# Patient Record
Sex: Male | Born: 1950 | Race: White | Hispanic: No | Marital: Single | Smoking: Current every day smoker
Health system: Southern US, Community
[De-identification: ages and names within clinical notes are randomized; demographics above are authoritative.]

## PROBLEM LIST (undated history)

## (undated) DIAGNOSIS — T7840XA Allergy, unspecified, initial encounter: Secondary | ICD-10-CM

## (undated) DIAGNOSIS — R519 Headache, unspecified: Secondary | ICD-10-CM

## (undated) DIAGNOSIS — F32A Depression, unspecified: Secondary | ICD-10-CM

## (undated) DIAGNOSIS — C029 Malignant neoplasm of tongue, unspecified: Secondary | ICD-10-CM

## (undated) DIAGNOSIS — D649 Anemia, unspecified: Secondary | ICD-10-CM

## (undated) DIAGNOSIS — F329 Major depressive disorder, single episode, unspecified: Secondary | ICD-10-CM

## (undated) DIAGNOSIS — E119 Type 2 diabetes mellitus without complications: Secondary | ICD-10-CM

## (undated) DIAGNOSIS — F419 Anxiety disorder, unspecified: Secondary | ICD-10-CM

## (undated) DIAGNOSIS — R0600 Dyspnea, unspecified: Secondary | ICD-10-CM

## (undated) HISTORY — DX: Malignant neoplasm of tongue, unspecified: C02.9

## (undated) HISTORY — DX: Allergy, unspecified, initial encounter: T78.40XA

## (undated) HISTORY — DX: Anemia, unspecified: D64.9

---
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
## 2019-08-15 DIAGNOSIS — K1379 Other lesions of oral mucosa: Secondary | ICD-10-CM

## 2019-08-15 HISTORY — DX: Other lesions of oral mucosa: K13.79

## 2019-09-01 ENCOUNTER — Other Ambulatory Visit: Payer: Self-pay

## 2019-09-01 ENCOUNTER — Inpatient Hospital Stay
Admission: EM | Admit: 2019-09-01 | Discharge: 2019-09-05 | DRG: 064 | Disposition: A | Discharge status: Home Health | Payer: Medicare Other | Attending: Internal Medicine | Admitting: Internal Medicine

## 2019-09-01 ENCOUNTER — Emergency Department: Payer: Medicare Other

## 2019-09-01 ENCOUNTER — Encounter: Payer: Self-pay | Admitting: Emergency Medicine

## 2019-09-01 ENCOUNTER — Inpatient Hospital Stay: Payer: Medicare Other

## 2019-09-01 DIAGNOSIS — E162 Hypoglycemia, unspecified: Secondary | ICD-10-CM

## 2019-09-01 DIAGNOSIS — R531 Weakness: Secondary | ICD-10-CM | POA: Diagnosis present

## 2019-09-01 DIAGNOSIS — F17210 Nicotine dependence, cigarettes, uncomplicated: Secondary | ICD-10-CM | POA: Diagnosis present

## 2019-09-01 DIAGNOSIS — E785 Hyperlipidemia, unspecified: Secondary | ICD-10-CM | POA: Diagnosis present

## 2019-09-01 DIAGNOSIS — E1151 Type 2 diabetes mellitus with diabetic peripheral angiopathy without gangrene: Secondary | ICD-10-CM | POA: Diagnosis present

## 2019-09-01 DIAGNOSIS — I6340 Cerebral infarction due to embolism of unspecified cerebral artery: Secondary | ICD-10-CM | POA: Diagnosis present

## 2019-09-01 DIAGNOSIS — R64 Cachexia: Secondary | ICD-10-CM | POA: Diagnosis present

## 2019-09-01 DIAGNOSIS — K5900 Constipation, unspecified: Secondary | ICD-10-CM

## 2019-09-01 DIAGNOSIS — Z880 Allergy status to penicillin: Secondary | ICD-10-CM

## 2019-09-01 DIAGNOSIS — Z20828 Contact with and (suspected) exposure to other viral communicable diseases: Secondary | ICD-10-CM | POA: Diagnosis present

## 2019-09-01 DIAGNOSIS — K567 Ileus, unspecified: Secondary | ICD-10-CM | POA: Diagnosis not present

## 2019-09-01 DIAGNOSIS — I493 Ventricular premature depolarization: Secondary | ICD-10-CM | POA: Diagnosis present

## 2019-09-01 DIAGNOSIS — Z885 Allergy status to narcotic agent status: Secondary | ICD-10-CM

## 2019-09-01 DIAGNOSIS — E11649 Type 2 diabetes mellitus with hypoglycemia without coma: Secondary | ICD-10-CM | POA: Diagnosis present

## 2019-09-01 DIAGNOSIS — I10 Essential (primary) hypertension: Secondary | ICD-10-CM | POA: Diagnosis present

## 2019-09-01 DIAGNOSIS — Z23 Encounter for immunization: Secondary | ICD-10-CM

## 2019-09-01 DIAGNOSIS — E43 Unspecified severe protein-calorie malnutrition: Secondary | ICD-10-CM | POA: Insufficient documentation

## 2019-09-01 DIAGNOSIS — Z01810 Encounter for preprocedural cardiovascular examination: Secondary | ICD-10-CM | POA: Diagnosis not present

## 2019-09-01 DIAGNOSIS — I69351 Hemiplegia and hemiparesis following cerebral infarction affecting right dominant side: Secondary | ICD-10-CM

## 2019-09-01 DIAGNOSIS — I639 Cerebral infarction, unspecified: Secondary | ICD-10-CM | POA: Diagnosis not present

## 2019-09-01 DIAGNOSIS — Z681 Body mass index (BMI) 19 or less, adult: Secondary | ICD-10-CM

## 2019-09-01 DIAGNOSIS — I361 Nonrheumatic tricuspid (valve) insufficiency: Secondary | ICD-10-CM | POA: Diagnosis not present

## 2019-09-01 DIAGNOSIS — M270 Developmental disorders of jaws: Secondary | ICD-10-CM

## 2019-09-01 DIAGNOSIS — E440 Moderate protein-calorie malnutrition: Secondary | ICD-10-CM | POA: Diagnosis present

## 2019-09-01 DIAGNOSIS — I6389 Other cerebral infarction: Secondary | ICD-10-CM | POA: Diagnosis not present

## 2019-09-01 HISTORY — DX: Cerebral infarction, unspecified: I63.9

## 2019-09-01 LAB — DIFFERENTIAL
Abs Immature Granulocytes: 0.06 10*3/uL (ref 0.00–0.07)
Basophils Absolute: 0 10*3/uL (ref 0.0–0.1)
Basophils Relative: 1 %
Eosinophils Absolute: 0.1 10*3/uL (ref 0.0–0.5)
Eosinophils Relative: 1 %
Immature Granulocytes: 1 %
Lymphocytes Relative: 19 %
Lymphs Abs: 1.5 10*3/uL (ref 0.7–4.0)
Monocytes Absolute: 0.4 10*3/uL (ref 0.1–1.0)
Monocytes Relative: 5 %
Neutro Abs: 5.5 10*3/uL (ref 1.7–7.7)
Neutrophils Relative %: 73 %

## 2019-09-01 LAB — COMPREHENSIVE METABOLIC PANEL
ALT: 9 U/L (ref 0–44)
AST: 23 U/L (ref 15–41)
Albumin: 3.8 g/dL (ref 3.5–5.0)
Alkaline Phosphatase: 48 U/L (ref 38–126)
Anion gap: 13 (ref 5–15)
BUN: 23 mg/dL (ref 8–23)
CO2: 20 mmol/L — ABNORMAL LOW (ref 22–32)
Calcium: 10 mg/dL (ref 8.9–10.3)
Chloride: 107 mmol/L (ref 98–111)
Creatinine, Ser: 0.91 mg/dL (ref 0.61–1.24)
GFR calc Af Amer: >60 mL/min (ref >60)
GFR calc non Af Amer: >60 mL/min (ref >60)
Glucose, Bld: 107 mg/dL — ABNORMAL HIGH (ref 70–99)
Potassium: 4.1 mmol/L (ref 3.5–5.1)
Sodium: 140 mmol/L (ref 135–145)
Total Bilirubin: 0.6 mg/dL (ref 0.3–1.2)
Total Protein: 7.5 g/dL (ref 6.5–8.1)

## 2019-09-01 LAB — URINALYSIS, COMPLETE (UACMP) WITH MICROSCOPIC
Bacteria, UA: NONE SEEN
Bilirubin Urine: NEGATIVE
Glucose, UA: 50 mg/dL — AB
Hgb urine dipstick: NEGATIVE
Ketones, ur: NEGATIVE mg/dL
Leukocytes,Ua: NEGATIVE
Nitrite: NEGATIVE
Protein, ur: NEGATIVE mg/dL
Specific Gravity, Urine: 1.029 (ref 1.005–1.030)
Squamous Epithelial / HPF: NONE SEEN (ref 0–5)
pH: 5 (ref 5.0–8.0)

## 2019-09-01 LAB — URINE DRUG SCREEN, QUALITATIVE (ARMC ONLY)
Amphetamines, Ur Screen: NOT DETECTED
Barbiturates, Ur Screen: NOT DETECTED
Benzodiazepine, Ur Scrn: NOT DETECTED
Cannabinoid 50 Ng, Ur ~~LOC~~: NOT DETECTED
Cocaine Metabolite,Ur ~~LOC~~: NOT DETECTED
MDMA (Ecstasy)Ur Screen: NOT DETECTED
Methadone Scn, Ur: NOT DETECTED
Opiate, Ur Screen: NOT DETECTED
Phencyclidine (PCP) Ur S: NOT DETECTED
Tricyclic, Ur Screen: NOT DETECTED

## 2019-09-01 LAB — CBC
HCT: 36.1 % — ABNORMAL LOW (ref 39.0–52.0)
Hemoglobin: 12.4 g/dL — ABNORMAL LOW (ref 13.0–17.0)
MCH: 35.2 pg — ABNORMAL HIGH (ref 26.0–34.0)
MCHC: 34.3 g/dL (ref 30.0–36.0)
MCV: 102.6 fL — ABNORMAL HIGH (ref 80.0–100.0)
Platelets: 305 10*3/uL (ref 150–400)
RBC: 3.52 MIL/uL — ABNORMAL LOW (ref 4.22–5.81)
RDW: 18.6 % — ABNORMAL HIGH (ref 11.5–15.5)
WBC: 7.5 10*3/uL (ref 4.0–10.5)
nRBC: 0 % (ref 0.0–0.2)

## 2019-09-01 LAB — PROTIME-INR
INR: 0.9 (ref 0.8–1.2)
Prothrombin Time: 11.7 seconds (ref 11.4–15.2)

## 2019-09-01 LAB — GLUCOSE, CAPILLARY
Glucose-Capillary: 106 mg/dL — ABNORMAL HIGH (ref 70–99)
Glucose-Capillary: 127 mg/dL — ABNORMAL HIGH (ref 70–99)
Glucose-Capillary: 201 mg/dL — ABNORMAL HIGH (ref 70–99)
Glucose-Capillary: 45 mg/dL — ABNORMAL LOW (ref 70–99)
Glucose-Capillary: 66 mg/dL — ABNORMAL LOW (ref 70–99)
Glucose-Capillary: 72 mg/dL (ref 70–99)
Glucose-Capillary: 89 mg/dL (ref 70–99)
Glucose-Capillary: 91 mg/dL (ref 70–99)

## 2019-09-01 LAB — ETHANOL: Alcohol, Ethyl (B): <10 mg/dL (ref <10)

## 2019-09-01 LAB — APTT: aPTT: 32 seconds (ref 24–36)

## 2019-09-01 LAB — SARS CORONAVIRUS 2 (TAT 6-24 HRS): SARS Coronavirus 2: NEGATIVE

## 2019-09-01 MED ORDER — DEXTROSE-NACL 5-0.9 % IV SOLN: INTRAVENOUS | Status: DC

## 2019-09-01 MED ORDER — ATORVASTATIN CALCIUM 80 MG PO TABS
80.0000 mg | ORAL_TABLET | Freq: Every day | ORAL | Status: DC
  Administered 2019-09-01 – 2019-09-05 (×5): 80 mg via ORAL
  Filled 2019-09-01: qty 4
  Filled 2019-09-01 (×3): qty 1
  Filled 2019-09-01: qty 4
  Filled 2019-09-01: qty 1
  Filled 2019-09-01: qty 4
  Filled 2019-09-01: qty 1

## 2019-09-01 MED ORDER — STROKE: EARLY STAGES OF RECOVERY BOOK: Freq: Once | Status: AC

## 2019-09-01 MED ORDER — HYDRALAZINE HCL 50 MG PO TABS: 25.0000 mg | ORAL_TABLET | Freq: Three times a day (TID) | ORAL | Status: DC | PRN

## 2019-09-01 MED ORDER — SODIUM CHLORIDE 0.9 % IV BOLUS
1000.0000 mL | Freq: Once | INTRAVENOUS | Status: AC
  Administered 2019-09-01: 1000 mL via INTRAVENOUS

## 2019-09-01 MED ORDER — DEXTROSE 50 % IV SOLN: 25.0000 mL | Freq: Once | INTRAVENOUS | Status: AC

## 2019-09-01 MED ORDER — ACETAMINOPHEN 650 MG RE SUPP: 650.0000 mg | RECTAL | Status: DC | PRN

## 2019-09-01 MED ORDER — ASPIRIN 81 MG PO CHEW
324.0000 mg | CHEWABLE_TABLET | Freq: Once | ORAL | Status: AC
  Administered 2019-09-01: 324 mg via ORAL
  Filled 2019-09-01: qty 4

## 2019-09-01 MED ORDER — ENOXAPARIN SODIUM 40 MG/0.4ML ~~LOC~~ SOLN
40.0000 mg | SUBCUTANEOUS | Status: DC
  Filled 2019-09-01: qty 0.4

## 2019-09-01 MED ORDER — DEXTROSE 50 % IV SOLN
INTRAVENOUS | Status: AC
  Filled 2019-09-01: qty 50

## 2019-09-01 MED ORDER — ACETAMINOPHEN 160 MG/5ML PO SOLN
650.0000 mg | ORAL | Status: DC | PRN
  Filled 2019-09-01: qty 20.3

## 2019-09-01 MED ORDER — ACETAMINOPHEN 325 MG PO TABS
650.0000 mg | ORAL_TABLET | ORAL | Status: DC | PRN
  Administered 2019-09-02 – 2019-09-04 (×3): 650 mg via ORAL
  Filled 2019-09-01 (×3): qty 2

## 2019-09-01 MED ORDER — ENSURE ENLIVE PO LIQD
237.0000 mL | Freq: Three times a day (TID) | ORAL | Status: DC
  Administered 2019-09-02 – 2019-09-05 (×10): 237 mL via ORAL

## 2019-09-01 MED ORDER — DEXTROSE 50 % IV SOLN
50.0000 mL | INTRAVENOUS | Status: DC | PRN
  Administered 2019-09-01: 50 mL via INTRAVENOUS

## 2019-09-01 MED ORDER — ONDANSETRON HCL 4 MG/2ML IJ SOLN: 4.0000 mg | Freq: Three times a day (TID) | INTRAMUSCULAR | Status: DC | PRN

## 2019-09-01 MED ORDER — DEXTROSE 50 % IV SOLN
INTRAVENOUS | Status: AC
  Administered 2019-09-01: 25 mL via INTRAVENOUS
  Filled 2019-09-01: qty 50

## 2019-09-01 MED ORDER — ASPIRIN 81 MG PO CHEW
324.0000 mg | CHEWABLE_TABLET | Freq: Every day | ORAL | Status: DC
  Administered 2019-09-02: 324 mg via ORAL
  Filled 2019-09-01 (×2): qty 4

## 2019-09-01 MED ORDER — SENNOSIDES-DOCUSATE SODIUM 8.6-50 MG PO TABS
1.0000 | ORAL_TABLET | Freq: Every evening | ORAL | Status: DC | PRN
  Administered 2019-09-05: 1 via ORAL
  Filled 2019-09-01: qty 1

## 2019-09-01 NOTE — ED Triage Notes (Signed)
Pt arrival from home due to weakness. Pt states he's had a previous stroke and that on Tuesday he collapsed and has been feeling left sided weakness since.

## 2019-09-01 NOTE — ED Notes (Signed)
ED TO INPATIENT HANDOFF REPORT  ED Nurse Name and Phone #: Torrie Mayers, RN [phone]  S Name/Age/Gender Eugene Strong 68 y.o. male Room/Bed: ED30A/ED30A  Code Status   Code Status: Full Code  Home/SNF/Other Home Patient oriented to: self, place, time and situation Is this baseline? Yes   Triage Complete: Triage complete  Chief Complaint Stroke [facility]) [I63.9]  Triage Note Pt arrival from home due to weakness. Pt states he's had a previous stroke and that on Tuesday he collapsed and has been feeling left sided weakness since.     Allergies Allergies  Allergen Reactions  . Codeine   . Penicillins     Level of Care/Admitting Diagnosis ED Disposition    ED Disposition Condition [facility]: [place] [[id number]]  Level of Care: Med-Surg [16]  Covid Evaluation: Asymptomatic Screening Protocol (No Symptoms)  [facility]: [id number]  Admitting Physician: Ivor Costa [[id number]]  Attending Physician: Ivor Costa [phone]  Estimated length of stay: past midnight tomorrow  Certification:: I certify this patient will need inpatient services for at least 2 midnights       B Medical/Surgery History Past Medical History:  Diagnosis Date  . Stroke [facility])    History reviewed. No pertinent surgical history.   A IV Location/Drains/Wounds Patient Lines/Drains/Airways Status   Active Line/Drains/Airways    Name:   Placement date:   Placement time:   Site:   Days:   Peripheral IV 09/01/19 Right Forearm   09/01/19    1039    Forearm   less than 1          Intake/Output Last 24 hours  Intake/Output Summary (Last 24 hours) at 09/01/2019 2355 Last data filed at 09/01/2019 1905 Gross per 24 hour  Intake 296.41 ml  Output --  Net 296.41 ml    Labs/Imaging Results for orders placed or performed during the hospital encounter of 09/01/19 (from the past 48 hour(s))  Glucose, capillary     Status: Abnormal   Collection Time:  09/01/19 10:31 AM  Result Value Ref Range   Glucose-Capillary 45 (L) 70 - 99 mg/dL  Glucose, capillary     Status: Abnormal   Collection Time: 09/01/19 10:34 AM  Result Value Ref Range   Glucose-Capillary 35 (LL) 70 - 99 mg/dL  CBC     Status: Abnormal   Collection Time: 09/01/19 10:38 AM  Result Value Ref Range   WBC 7.5 4.0 - 10.5 K/uL   RBC 3.52 (L) 4.22 - 5.81 MIL/uL   Hemoglobin 12.4 (L) 13.0 - 17.0 g/dL   HCT 36.1 (L) 39.0 - 52.0 %   MCV 102.6 (H) 80.0 - 100.0 fL   MCH 35.2 (H) 26.0 - 34.0 pg   MCHC 34.3 30.0 - 36.0 g/dL   RDW 18.6 (H) 11.5 - 15.5 %   Platelets 305 150 - 400 K/uL   nRBC 0.0 0.0 - 0.2 %    Comment: Performed at [facility], [address] [place], [place] [address]  Protime-INR     Status: None   Collection Time: 09/01/19 10:38 AM  Result Value Ref Range   Prothrombin Time 11.7 11.4 - 15.2 seconds   INR 0.9 0.8 - 1.2    Comment: (NOTE) INR goal varies based on device and disease states. Performed at [facility], [address] [place], [place] [address]   APTT     Status: None   Collection Time: 09/01/19 10:38 AM  Result  Value Ref Range   aPTT 32 24 - 36 seconds    Comment: Performed at [facility], [address] [place], [place] [address]  Comprehensive metabolic panel     Status: Abnormal   Collection Time: 09/01/19 10:38 AM  Result Value Ref Range   Sodium 140 135 - 145 mmol/L   Potassium 4.1 3.5 - 5.1 mmol/L   Chloride 107 98 - 111 mmol/L   CO2 20 (L) 22 - 32 mmol/L   Glucose, Bld 107 (H) 70 - 99 mg/dL   BUN 23 8 - 23 mg/dL   Creatinine, Ser 0.91 0.61 - 1.24 mg/dL   Calcium 10.0 8.9 - 10.3 mg/dL   Total Protein 7.5 6.5 - 8.1 g/dL   Albumin 3.8 3.5 - 5.0 g/dL   AST 23 15 - 41 U/L   ALT 9 0 - 44 U/L   Alkaline Phosphatase 48 38 - 126 U/L   Total Bilirubin 0.6 0.3 - 1.2 mg/dL   GFR calc non Af Amer >60 >60 mL/min   GFR calc Af Amer >60 >60 mL/min   Anion gap 13 5 - 15    Comment: Performed at [facility], [address] [place], [place] [address]  Differential     Status: None   Collection Time: 09/01/19 10:38 AM  Result Value Ref Range   Neutrophils Relative % 73 %   Neutro Abs 5.5 1.7 - 7.7 K/uL   Lymphocytes Relative 19 %   Lymphs Abs 1.5 0.7 - 4.0 K/uL   Monocytes Relative 5 %   Monocytes Absolute 0.4 0.1 - 1.0 K/uL   Eosinophils Relative 1 %   Eosinophils Absolute 0.1 0.0 - 0.5 K/uL   Basophils Relative 1 %   Basophils Absolute 0.0 0.0 - 0.1 K/uL   Immature Granulocytes 1 %   Abs Immature Granulocytes 0.06 0.00 - 0.07 K/uL    Comment: Performed at [facility], [address] [place], [place] [address]  Glucose, capillary     Status: Abnormal   Collection Time: 09/01/19 10:45 AM  Result Value Ref Range   Glucose-Capillary 106 (H) 70 - 99 mg/dL  Ethanol     Status: None   Collection Time: 09/01/19 11:05 AM  Result Value Ref Range   Alcohol, Ethyl (B) <10 <10 mg/dL    Comment: (NOTE) Lowest detectable limit for serum alcohol is 10 mg/dL. For medical purposes only. Performed at [facility], [place], [place] [address]   SARS CORONAVIRUS 2 (TAT 6-24 HRS) Nasopharyngeal Nasopharyngeal Swab     Status: None   Collection Time: 09/01/19 12:28 PM   Specimen: Nasopharyngeal Swab  Result Value Ref Range   SARS Coronavirus 2 NEGATIVE NEGATIVE    Comment: (NOTE) SARS-CoV-2 target nucleic acids are NOT DETECTED. The SARS-CoV-2 RNA is generally detectable in upper and lower respiratory specimens during the acute phase of infection. Negative results do not preclude SARS-CoV-2 infection, do not rule out co-infections with other pathogens, and should not be used as the sole basis for treatment or other patient management decisions. Negative results must be combined with clinical observations, patient history, and epidemiological information. The expected result is Negative. Fact Sheet for  Patients: SugarRoll.be Fact Sheet for Healthcare Providers: https://www.woods-mathews.com/ This test is not yet approved or cleared by the [place] FDA and  has been authorized for detection and/or diagnosis of SARS-CoV-2 by FDA under an Emergency Use Authorization (EUA). This EUA will remain  in effect (meaning  this test can be used) for the duration of the COVID-19 declaration under Section 56 4(b)(1) of the Act, 21 U.S.C. section 360bbb-3(b)(1), unless the authorization is terminated or revoked sooner. Performed at [facility], [place] [address], [place], [place] [address]   Glucose, capillary     Status: None   Collection Time: 09/01/19  2:05 PM  Result Value Ref Range   Glucose-Capillary 91 70 - 99 mg/dL  Glucose, capillary     Status: Abnormal   Collection Time: 09/01/19  4:01 PM  Result Value Ref Range   Glucose-Capillary 201 (H) 70 - 99 mg/dL  Glucose, capillary     Status: None   Collection Time: 09/01/19  7:04 PM  Result Value Ref Range   Glucose-Capillary 72 70 - 99 mg/dL  Glucose, capillary     Status: Abnormal   Collection Time: 09/01/19  8:17 PM  Result Value Ref Range   Glucose-Capillary 66 (L) 70 - 99 mg/dL  Glucose, capillary     Status: Abnormal   Collection Time: 09/01/19  9:41 PM  Result Value Ref Range   Glucose-Capillary 127 (H) 70 - 99 mg/dL  Urinalysis, Complete w Microscopic     Status: Abnormal   Collection Time: 09/01/19 10:11 PM  Result Value Ref Range   Color, Urine YELLOW (A) YELLOW   APPearance CLEAR (A) CLEAR   Specific Gravity, Urine 1.029 1.005 - 1.030   pH 5.0 5.0 - 8.0   Glucose, UA 50 (A) NEGATIVE mg/dL   Hgb urine dipstick NEGATIVE NEGATIVE   Bilirubin Urine NEGATIVE NEGATIVE   Ketones, ur NEGATIVE NEGATIVE mg/dL   Protein, ur NEGATIVE NEGATIVE mg/dL   Nitrite NEGATIVE NEGATIVE   Leukocytes,Ua NEGATIVE NEGATIVE   RBC / HPF 0-5 0 - 5 RBC/hpf   WBC, UA 0-5 0 - 5 WBC/hpf   Bacteria,  UA NONE SEEN NONE SEEN   Squamous Epithelial / LPF NONE SEEN 0 - 5   Mucus PRESENT    Hyaline Casts, UA PRESENT     Comment: Performed at [facility], [address], [place], [place] [address]  Urine Drug Screen, Qualitative     Status: None   Collection Time: 09/01/19 10:11 PM  Result Value Ref Range   Tricyclic, Ur Screen NONE DETECTED NONE DETECTED   Amphetamines, Ur Screen NONE DETECTED NONE DETECTED   MDMA (Ecstasy)Ur Screen NONE DETECTED NONE DETECTED   Cocaine Metabolite,Ur [place] NONE DETECTED NONE DETECTED   Opiate, Ur Screen NONE DETECTED NONE DETECTED   Phencyclidine (PCP) Ur S NONE DETECTED NONE DETECTED   Cannabinoid 50 Ng, Ur [place] NONE DETECTED NONE DETECTED   Barbiturates, Ur Screen NONE DETECTED NONE DETECTED   Benzodiazepine, Ur Scrn NONE DETECTED NONE DETECTED   Methadone Scn, Ur NONE DETECTED NONE DETECTED    Comment: (NOTE) Tricyclics + metabolites, urine    Cutoff 1000 ng/mL Amphetamines + metabolites, urine  Cutoff 1000 ng/mL MDMA (Ecstasy), urine              Cutoff 500 ng/mL Cocaine Metabolite, urine          Cutoff 300 ng/mL Opiate + metabolites, urine        Cutoff 300 ng/mL Phencyclidine (PCP), urine         Cutoff 25 ng/mL Cannabinoid, urine                 Cutoff 50 ng/mL Barbiturates + metabolites, urine  Cutoff 200 ng/mL Benzodiazepine, urine  Cutoff 200 ng/mL Methadone, urine                   Cutoff 300 ng/mL The urine drug screen provides only a preliminary, unconfirmed analytical test result and should not be used for non-medical purposes. Clinical consideration and professional judgment should be applied to any positive drug screen result due to possible interfering substances. A more specific alternate chemical method must be used in order to obtain a confirmed analytical result. Gas chromatography / mass spectrometry (GC/MS) is the preferred confirmat ory method. Performed at [facility], [address]  [place], [place] [address]   Glucose, capillary     Status: None   Collection Time: 09/01/19 11:40 PM  Result Value Ref Range   Glucose-Capillary 89 70 - 99 mg/dL   CT HEAD WO CONTRAST  Result Date: 09/01/2019 CLINICAL DATA:  Left-sided weakness EXAM: CT HEAD WITHOUT CONTRAST TECHNIQUE: Contiguous axial images were obtained from the base of the skull through the vertex without intravenous contrast. COMPARISON:  None. FINDINGS: Brain: There is mild diffuse atrophy. There is no intracranial mass, hemorrhage, extra-axial fluid collection, or midline shift. There is mild small vessel disease in the centra semiovale bilaterally. There is an area of decreased attenuation at the gray-white junction of the mid right frontal lobe, a finding concerning for recent and potentially acute infarct. There is decreased attenuation in the head of the caudate nucleus on the right and immediately adjacent anterior limb of the right external capsule. This area may represent an older infarct. No other findings suggesting potential acute infarct present. Vascular: No hyperdense vessel. There is calcification in each carotid siphon region. Skull: The bony calvarium appears intact. Sinuses/Orbits: There is mucosal thickening involving multiple ethmoid air cells. Other visualized paranasal sinuses are clear. There is a concha bullosa on the right, an anatomic variant. There is leftward deviation of the nasal septum. Visualized orbits appear symmetric bilaterally. Other: Visualized mastoid air cells are clear. IMPRESSION: 1. Atrophy with periventricular small vessel disease. Age uncertain but potentially recent/acute infarct in the mid right frontal lobe at the gray-white junction. Older appearing infarct involving a portion of the head of the caudate nucleus on the right in the immediately adjacent anterior limb of the right external capsule. No mass or hemorrhage. 2.  There are foci of arterial vascular calcification. 3. Mucosal  thickening in several ethmoid air cells. Deviated nasal septum. Electronically Signed   By: Lowella Grip III M.D.   On: 09/01/2019 11:37   MR ANGIO HEAD WO CONTRAST  Result Date: 09/01/2019 CLINICAL DATA:  Initial evaluation for focal neural deficit, possible stroke. EXAM: MRI HEAD WITHOUT CONTRAST MRA HEAD WITHOUT CONTRAST TECHNIQUE: Multiplanar, multiecho pulse sequences of the brain and surrounding structures were obtained without intravenous contrast. Angiographic images of the head were obtained using MRA technique without contrast. COMPARISON:  Prior CT from earlier the same day. FINDINGS: MRI HEAD FINDINGS Brain: Diffuse prominence of the CSF containing spaces compatible with generalized age-related cerebral atrophy. Patchy T2/FLAIR hyperintensity within the periventricular deep white matter both cerebral hemispheres most consistent with chronic small vessel ischemic disease, mild to moderate nature. Few scattered superimposed remote lacunar infarcts seen involving the bilateral thalami, basal ganglia, and hemispheric cerebral white matter. Small remote cortical/subcortical infarct noted involving the anterior right frontal lobe (series 20, image 32). Punctate 3 mm focus of restricted diffusion seen involving the cortex of the left parietal lobe, consistent with a small acute ischemic infarct (series 5, image 36). Additional  faint diffusion abnormality seen involving the cortex of the left occipital lobe (series 5, image 22), acute to subacute in appearance. No associated hemorrhage or mass effect. No other abnormal foci of restricted diffusion to suggest acute or subacute ischemia. Gray-white matter differentiation otherwise maintained. No acute intracranial hemorrhage. Single subcentimeter focus of susceptibility artifact noted within left parietal lobe, consistent with a small microhemorrhage, likely small vessel related. No mass lesion, midline shift or mass effect. No hydrocephalus. No  extra-axial fluid collection. Pituitary gland suprasellar region within normal limits. Midline structures intact and normal. Vascular: Major intracranial vascular flow voids are maintained. Skull and upper cervical spine: Craniocervical junction within normal limits. Bone marrow signal intensity normal. No scalp soft tissue abnormality. Sinuses/Orbits: Globes and orbital soft tissues within normal limits. Paranasal sinuses are largely clear. Right-to-left nasal septal deviation with associated concha bullosa noted. 8 mm well-circumscribed T2 hyperintense nodule at the left nasal bridge, indeterminate (series 15, image 7). Other: No mastoid effusion.  Inner ear structures grossly normal. MRA HEAD FINDINGS ANTERIOR CIRCULATION: Visualized distal cervical segments of the internal carotid arteries are widely patent with symmetric antegrade flow. Petrous segments widely patent. Multifocal atheromatous irregularity throughout the cavernous/supraclinoid ICAs without high-grade flow-limiting stenosis, slightly worse on the right. ICA termini well perfused. Right A1 widely patent. Short-segment severe stenosis noted at the proximal left M1 segment (series 9, image 106). Normal anterior communicating artery. Anterior cerebral arteries widely patent to their distal aspects without high-grade stenosis. No M1 stenosis or occlusion. Normal MCA bifurcations. Distal MCA branches well perfused and symmetric, although demonstrate diffuse small vessel atheromatous irregularity. POSTERIOR CIRCULATION: Dominant left vertebral artery widely patent to the vertebrobasilar junction. Patent left PICA. Right vertebral artery diffusely hypoplastic and not well seen, although there is flow related signal within the distal right V4 segment with perfusion of the right PICA. Basilar widely patent to its distal aspect without stenosis. Superior cerebral arteries patent bilaterally. Left PCA is supplied primarily via the basilar. Right PCA supplied  via the basilar as well as a robust right posterior communicating artery. Multifocal atheromatous irregularity throughout both PCAs, which are both patent to their distal aspects. Short-segment severe distal right P3 stenosis noted (series 1059, image 6). No intracranial aneurysm or other vascular abnormality. IMPRESSION: MRI HEAD IMPRESSION: 1. Few tiny subcentimeter acute to early subacute cortical infarcts involving the left parietal and occipital lobes as above. No associated hemorrhage. 2. Underlying age-related cerebral atrophy with moderate chronic microvascular ischemic disease. 3. 8 mm nodule involving the skin of the left nasal bridge, indeterminate. Correlation with physical exam recommended. MRA HEAD IMPRESSION: 1. No large vessel occlusion. 2. 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 No other proximal high-grade or correctable stenosis. Electronically Signed   By: Jeannine Boga M.D.   On: 09/01/2019 22:01   MR BRAIN WO CONTRAST  Result Date: 09/01/2019 CLINICAL DATA:  Initial evaluation for focal neural deficit, possible stroke. EXAM: MRI HEAD WITHOUT CONTRAST MRA HEAD WITHOUT CONTRAST TECHNIQUE: Multiplanar, multiecho pulse sequences of the brain and surrounding structures were obtained without intravenous contrast. Angiographic images of the head were obtained using MRA technique without contrast. COMPARISON:  Prior CT from earlier the same day. FINDINGS: MRI HEAD FINDINGS Brain: Diffuse prominence of the CSF containing spaces compatible with generalized age-related cerebral atrophy. Patchy T2/FLAIR hyperintensity within the periventricular deep white matter both cerebral hemispheres most consistent with chronic small vessel ischemic disease, mild to moderate nature. Few scattered superimposed remote lacunar infarcts  seen involving the bilateral thalami, basal ganglia, and hemispheric cerebral white matter. Small remote  cortical/subcortical infarct noted involving the anterior right frontal lobe (series 20, image 32). Punctate 3 mm focus of restricted diffusion seen involving the cortex of the left parietal lobe, consistent with a small acute ischemic infarct (series 5, image 36). Additional faint diffusion abnormality seen involving the cortex of the left occipital lobe (series 5, image 22), acute to subacute in appearance. No associated hemorrhage or mass effect. No other abnormal foci of restricted diffusion to suggest acute or subacute ischemia. Gray-white matter differentiation otherwise maintained. No acute intracranial hemorrhage. Single subcentimeter focus of susceptibility artifact noted within left parietal lobe, consistent with a small microhemorrhage, likely small vessel related. No mass lesion, midline shift or mass effect. No hydrocephalus. No extra-axial fluid collection. Pituitary gland suprasellar region within normal limits. Midline structures intact and normal. Vascular: Major intracranial vascular flow voids are maintained. Skull and upper cervical spine: Craniocervical junction within normal limits. Bone marrow signal intensity normal. No scalp soft tissue abnormality. Sinuses/Orbits: Globes and orbital soft tissues within normal limits. Paranasal sinuses are largely clear. Right-to-left nasal septal deviation with associated concha bullosa noted. 8 mm well-circumscribed T2 hyperintense nodule at the left nasal bridge, indeterminate (series 15, image 7). Other: No mastoid effusion.  Inner ear structures grossly normal. MRA HEAD FINDINGS ANTERIOR CIRCULATION: Visualized distal cervical segments of the internal carotid arteries are widely patent with symmetric antegrade flow. Petrous segments widely patent. Multifocal atheromatous irregularity throughout the cavernous/supraclinoid ICAs without high-grade flow-limiting stenosis, slightly worse on the right. ICA termini well perfused. Right A1 widely patent.  Short-segment severe stenosis noted at the proximal left M1 segment (series 9, image 106). Normal anterior communicating artery. Anterior cerebral arteries widely patent to their distal aspects without high-grade stenosis. No M1 stenosis or occlusion. Normal MCA bifurcations. Distal MCA branches well perfused and symmetric, although demonstrate diffuse small vessel atheromatous irregularity. POSTERIOR CIRCULATION: Dominant left vertebral artery widely patent to the vertebrobasilar junction. Patent left PICA. Right vertebral artery diffusely hypoplastic and not well seen, although there is flow related signal within the distal right V4 segment with perfusion of the right PICA. Basilar widely patent to its distal aspect without stenosis. Superior cerebral arteries patent bilaterally. Left PCA is supplied primarily via the basilar. Right PCA supplied via the basilar as well as a robust right posterior communicating artery. Multifocal atheromatous irregularity throughout both PCAs, which are both patent to their distal aspects. Short-segment severe distal right P3 stenosis noted (series 1059, image 6). No intracranial aneurysm or other vascular abnormality. IMPRESSION: MRI HEAD IMPRESSION: 1. Few tiny subcentimeter acute to early subacute cortical infarcts involving the left parietal and occipital lobes as above. No associated hemorrhage. 2. Underlying age-related cerebral atrophy with moderate chronic microvascular ischemic disease. 3. 8 mm nodule involving the skin of the left nasal bridge, indeterminate. Correlation with physical exam recommended. MRA HEAD IMPRESSION: 1. No large vessel occlusion. 2. 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 No other proximal high-grade or correctable stenosis. Electronically Signed   By: Jeannine Boga M.D.   On: 09/01/2019 22:01   DG Chest Portable 1 View  Result Date: 09/01/2019 CLINICAL DATA:  Weakness  with productive cough. EXAM: PORTABLE CHEST 1 VIEW COMPARISON:  None. FINDINGS: Lungs are hyperexpanded without consolidation or effusion. Cardiomediastinal silhouette, bones and soft tissues are normal. IMPRESSION: Hyperexpansion without acute cardiopulmonary disease. Electronically Signed   By: Marin Olp M.D.  On: 09/01/2019 11:37   CT Maxillofacial Wo Contrast  Result Date: 09/01/2019 CLINICAL DATA:  Facial swelling EXAM: CT MAXILLOFACIAL WITHOUT CONTRAST TECHNIQUE: Multidetector CT imaging of the maxillofacial structures was performed. Multiplanar CT image reconstructions were also generated. COMPARISON:  None. FINDINGS: Osseous: No acute fracture. Bony nasal septum is deviated to the left without fracture. Orbital walls are intact. Temporomandibular joints are aligned without dislocation. 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 Right-sided lesion measures up to 2.5 cm with encroachment on the floor of mouth and superiorly deviates the anterior tongue. Remaining mandibular dentition with small periapical lucencies. No cortical destruction or periostitis. Orbits: Negative. No traumatic or inflammatory finding. Sinuses: Clear. Soft tissues: No focal soft tissue swelling. No fluid collection. Limited intracranial: See concurrent CT brain report. IMPRESSION: 1. No acute facial bone fracture. 2. Remaining mandibular dentition with small periapical lucencies. No cortical destruction or periostitis. No focal soft tissue swelling or fluid collection. 3. 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 Right-sided lesion measures up to 2.5 cm with encroachment on the floor of mouth and superior deviation of the tongue. Electronically Signed   By: Davina Poke M.D.   On: 09/01/2019 11:50    Pending  Labs Unresulted Labs (From admission, onward)    Start     Ordered   09/02/19 0500  Hemoglobin A1c  Tomorrow morning,   STAT     09/01/19 1925   09/02/19 0500  Lipid panel  Tomorrow morning,   STAT    Comments: Fasting    09/01/19 1925   09/02/19 0500  HIV Antibody (routine testing w rflx)  (HIV Antibody (Routine testing w reflex) panel)  Once,   STAT     09/01/19 2113          Vitals/Pain Today's Vitals   09/01/19 1809 09/01/19 1923 09/01/19 2233 09/01/19 2341  BP: (!) 164/66 (!) 166/66 (!) 152/62 (!) 146/72  Pulse: (!) 59 61 60 65  Resp: 16 18 12  (!) 21  Temp:    97.6 F (36.4 C)  TempSrc:    Oral  SpO2: 100% 100% 100% 100%  Weight:      Height:      PainSc:   0-No pain     Isolation Precautions No active isolations  Medications Medications  dextrose 50 % solution 50 mL ( Intravenous Not Given 09/01/19 2232)   stroke: mapping our early stages of recovery book (has no administration in time range)  acetaminophen (TYLENOL) tablet 650 mg (has no administration in time range)    Or  acetaminophen (TYLENOL) 160 MG/5ML solution 650 mg (has no administration in time range)    Or  acetaminophen (TYLENOL) suppository 650 mg (has no administration in time range)  senna-docusate (Senokot-S) tablet 1 tablet (has no administration in time range)  enoxaparin (LOVENOX) injection 40 mg (has no administration in time range)  aspirin chewable tablet 324 mg (has no administration in time range)  atorvastatin (LIPITOR) tablet 80 mg (80 mg Oral Given 09/01/19 1745)  feeding supplement (ENSURE ENLIVE) (ENSURE ENLIVE) liquid 237 mL (237 mLs Oral Not Given 09/01/19 1924)  ondansetron (ZOFRAN) injection 4 mg (has no administration in time range)  hydrALAZINE (APRESOLINE) tablet 25 mg (has no administration in time range)  dextrose 50 % solution 25 mL (25 mLs Intravenous Given 09/01/19 1049)  sodium chloride  0.9 % bolus 1,000 mL (0 mLs Intravenous Stopped 09/01/19 1334)  aspirin chewable  tablet 324 mg (324 mg Oral Given 09/01/19 1215)    Mobility walks Low fall risk   Focused Assessments Neuro Assessment Handoff:  Swallow screen pass? Yes          Neuro Assessment: Within Defined Limits Neuro Checks:      Last Documented NIHSS Modified Score:   Has TPA been given? No If patient is a Neuro Trauma and patient is going to OR before floor call report to [place] nurse: [phone] or [phone]     R Recommendations: See Admitting Provider Note  Report given to:   Additional Notes:  Pt 's blood sugar being monitor every 2 hrs.

## 2019-09-01 NOTE — ED Notes (Signed)
Pt had blood sugar of 45 and then 35 the second time. Pt has cold extremities, hasn't eaten in a few days. Pt given apple juice with sugar. 3rd blood sugar 106, Dr. Quentin Cornwall notified.

## 2019-09-01 NOTE — ED Notes (Signed)
Bear hugger applied to warm pt.

## 2019-09-01 NOTE — H&P (Addendum)
History and Physical    Eugene Strong [id number] DOB: Jan 07, 1951 DOA: 09/01/2019  Referring MD/NP/PA:   PCP: Patient, No Pcp Per   Patient coming from:  The patient is coming from home.  At baseline, pt is independent for most of ADL.        Chief Complaint: Left-sided weakness  HPI: Eugene Strong is a 67 y.o. male with medical history significant of stroke, former smoker, not seeing doctors for 20 years, who presents with left-sided weakness.  Pt states that he may have had another stroke 1 week ago, decreased as left sided weakness, and fall without significant injury. Did not seek medical attention. No headache or neck pain.  No vision loss or hearing loss. He states he also had an episode over 1 month ago where he initially thought he had a stroke where he became weak on the left side, and since then has had some slurred speech.  Denies chest pain, shortness of breath, cough,.  No nausea vomiting, diarrhea, abdominal pain, symptoms of UTI.  States has been having some sores and swelling in his mouth causing him to have decreased oral intake. Pt was found to have hypoglycemia with blood sugar 30s, which improved to 90 -110 after giving D50.  ED Course: pt was found to have WBC 7.5, INR 0.9, PTT 32, pending COVID-19 test, alcohol less than 10, electrolytes renal function okay, blood pressure 182/73, heart rate 62, oxygen saturation 95% on room air.  Chest x-ray has no infiltration.  Patient is admitted to [facility] bed as inpatient.  Message sent to Dr. Doy Mince of neuro for consultation.  CT-head showed: 1. Atrophy with periventricular small vessel disease. Age uncertain but potentially recent/acute infarct in the mid right frontal lobe at the gray-white junction. Older appearing infarct involving a portion of the head of the caudate nucleus on the right in the immediately adjacent anterior limb of the right external capsule. No mass or hemorrhage. 2.  There are foci of arterial vascular  calcification. 3. Mucosal thickening in several ethmoid air cells. Deviated nasal septum.  CT-maxillofacial: 1. No acute facial bone fracture. 2. Remaining mandibular dentition with small periapical lucencies. No cortical destruction or periostitis. No focal soft tissue swelling or fluid collection.  3. 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 Right-sided lesion measures up to 2.5 cm with encroachment on the floor of mouth and superior deviation of the tongue.   Review of Systems:   General: no fevers, chills, no body weight gain, has poor appetite, has fatigue HEENT: no blurry vision, hearing changes or sore throat Respiratory: no dyspnea, coughing, wheezing CV: no chest pain, no palpitations GI: no nausea, vomiting, abdominal pain, diarrhea, constipation GU: no dysuria, burning on urination, increased urinary frequency, hematuria  Ext: no leg edema Neuro: has left sided weakness, no vision change or hearing loss Skin: no rash, no skin tear. MSK: No muscle spasm, no deformity, no limitation of range of movement in spin Heme: No easy bruising.  Travel history: No recent long distant travel.  Allergy:  Allergies  Allergen Reactions  . Codeine   . Penicillins     Past Medical History:  Diagnosis Date  . Stroke [facility])     History reviewed. No pertinent surgical history.  Social History:  reports that he has quit smoking. He has never used smokeless tobacco. He reports previous alcohol use. He reports that he does not use  drugs.  Family History:  Family History  Problem Relation Age of Onset  . Goiter Father      Prior to Admission medications   Not on File    Physical Exam: Vitals:   09/01/19 1603 09/01/19 1630 09/01/19 1700 09/01/19 1809  BP:  (!) 164/61 (!) 151/70 (!) 164/66  Pulse:  (!) 59 69 (!) 59  Resp:   18 16  Temp: 97.9 F (36.6 C)     TempSrc: Axillary      SpO2:  100% 100% 100%  Weight:      Height:       General: Not in acute distress. Thin body habitus HEENT:       Eyes: PERRL, EOMI, no scleral icterus.       ENT: No discharge from the ears and nose, no pharynx injection, no tonsillar enlargement.        Neck: No JVD, no bruit, no mass felt. Heme: No neck lymph node enlargement. Cardiac: S1/S2, RRR, No murmurs, No gallops or rubs. Respiratory: No rales, wheezing, rhonchi or rubs. GI: Soft, nondistended, nontender, no rebound pain, no organomegaly, BS present. GU: No hematuria Ext: No pitting leg edema bilaterally. 2+DP/PT pulse bilaterally. Musculoskeletal: No joint deformities, No joint redness or warmth, no limitation of ROM in spin. Skin: No rashes.  Neuro: Alert, oriented X3, cranial nerves II-XII grossly intact, moves all extremities normally. Muscle strength 5/5 in all extremities, sensation to light touch intact. Psych: Patient is not psychotic, no suicidal or hemocidal ideation.  Labs on Admission: I have personally reviewed following labs and imaging studies  CBC: Recent Labs  Lab 09/01/19 1038  WBC 7.5  NEUTROABS 5.5  HGB 12.4*  HCT 36.1*  MCV 102.6*  PLT 123456   Basic Metabolic Panel: Recent Labs  Lab 09/01/19 1038  NA 140  K 4.1  CL 107  CO2 20*  GLUCOSE 107*  BUN 23  CREATININE 0.91  CALCIUM 10.0   GFR: Estimated Creatinine Clearance: 44.2 mL/min (by C-G formula based on SCr of 0.91 mg/dL). Liver Function Tests: Recent Labs  Lab 09/01/19 1038  AST 23  ALT 9  ALKPHOS 48  BILITOT 0.6  PROT 7.5  ALBUMIN 3.8   No results for input(s): LIPASE, AMYLASE in the last 168 hours. No results for input(s): AMMONIA in the last 168 hours. Coagulation Profile: Recent Labs  Lab 09/01/19 1038  INR 0.9   Cardiac Enzymes: No results for input(s): CKTOTAL, CKMB, CKMBINDEX, TROPONINI in the last 168 hours. BNP (last 3 results) No results for input(s): PROBNP in the last 8760 hours. HbA1C: No results  for input(s): HGBA1C in the last 72 hours. CBG: Recent Labs  Lab 09/01/19 1031 09/01/19 1034 09/01/19 1045 09/01/19 1405 09/01/19 1601  GLUCAP 45* 35* 106* 91 201*   Lipid Profile: No results for input(s): CHOL, HDL, LDLCALC, TRIG, CHOLHDL, LDLDIRECT in the last 72 hours. Thyroid Function Tests: No results for input(s): TSH, T4TOTAL, FREET4, T3FREE, THYROIDAB in the last 72 hours. Anemia Panel: No results for input(s): VITAMINB12, FOLATE, FERRITIN, TIBC, IRON, RETICCTPCT in the last 72 hours. Urine analysis: No results found for: COLORURINE, APPEARANCEUR, LABSPEC, PHURINE, GLUCOSEU, HGBUR, BILIRUBINUR, KETONESUR, PROTEINUR, UROBILINOGEN, NITRITE, LEUKOCYTESUR Sepsis Labs: @LABRCNTIP (procalcitonin:4,lacticidven:4) )No results found for this or any previous visit (from the past 240 hour(s)).   Radiological Exams on Admission: CT HEAD WO CONTRAST  Result Date: 09/01/2019 CLINICAL DATA:  Left-sided weakness EXAM: CT HEAD WITHOUT CONTRAST TECHNIQUE: Contiguous axial images were obtained from the base of the  skull through the vertex without intravenous contrast. COMPARISON:  None. FINDINGS: Brain: There is mild diffuse atrophy. There is no intracranial mass, hemorrhage, extra-axial fluid collection, or midline shift. There is mild small vessel disease in the centra semiovale bilaterally. There is an area of decreased attenuation at the gray-white junction of the mid right frontal lobe, a finding concerning for recent and potentially acute infarct. There is decreased attenuation in the head of the caudate nucleus on the right and immediately adjacent anterior limb of the right external capsule. This area may represent an older infarct. No other findings suggesting potential acute infarct present. Vascular: No hyperdense vessel. There is calcification in each carotid siphon region. Skull: The bony calvarium appears intact. Sinuses/Orbits: There is mucosal thickening involving multiple ethmoid air  cells. Other visualized paranasal sinuses are clear. There is a concha bullosa on the right, an anatomic variant. There is leftward deviation of the nasal septum. Visualized orbits appear symmetric bilaterally. Other: Visualized mastoid air cells are clear. IMPRESSION: 1. Atrophy with periventricular small vessel disease. Age uncertain but potentially recent/acute infarct in the mid right frontal lobe at the gray-white junction. Older appearing infarct involving a portion of the head of the caudate nucleus on the right in the immediately adjacent anterior limb of the right external capsule. No mass or hemorrhage. 2.  There are foci of arterial vascular calcification. 3. Mucosal thickening in several ethmoid air cells. Deviated nasal septum. Electronically Signed   By: Lowella Grip III M.D.   On: 09/01/2019 11:37   DG Chest Portable 1 View  Result Date: 09/01/2019 CLINICAL DATA:  Weakness with productive cough. EXAM: PORTABLE CHEST 1 VIEW COMPARISON:  None. FINDINGS: Lungs are hyperexpanded without consolidation or effusion. Cardiomediastinal silhouette, bones and soft tissues are normal. IMPRESSION: Hyperexpansion without acute cardiopulmonary disease. Electronically Signed   By: Marin Olp M.D.   On: 09/01/2019 11:37   CT Maxillofacial Wo Contrast  Result Date: 09/01/2019 CLINICAL DATA:  Facial swelling EXAM: CT MAXILLOFACIAL WITHOUT CONTRAST TECHNIQUE: Multidetector CT imaging of the maxillofacial structures was performed. Multiplanar CT image reconstructions were also generated. COMPARISON:  None. FINDINGS: Osseous: No acute fracture. Bony nasal septum is deviated to the left without fracture. Orbital walls are intact. Temporomandibular joints are aligned without dislocation. 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 Right-sided lesion measures up to 2.5 cm with encroachment on the floor of mouth  and superiorly deviates the anterior tongue. Remaining mandibular dentition with small periapical lucencies. No cortical destruction or periostitis. Orbits: Negative. No traumatic or inflammatory finding. Sinuses: Clear. Soft tissues: No focal soft tissue swelling. No fluid collection. Limited intracranial: See concurrent CT brain report. IMPRESSION: 1. No acute facial bone fracture. 2. Remaining mandibular dentition with small periapical lucencies. No cortical destruction or periostitis. No focal soft tissue swelling or fluid collection. 3. 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 Right-sided lesion measures up to 2.5 cm with encroachment on the floor of mouth and superior deviation of the tongue. Electronically Signed   By: Davina Poke M.D.   On: 09/01/2019 11:50     EKG: Independently reviewed.  Sinus rhythm, QTC 409, anteroseptal infarction pattern   Assessment/Plan Principal Problem:   Stroke [facility]) Active Problems:   Hypoglycemia   HTN (hypertension)   Protein-calorie malnutrition, moderate ([place])   Stroke ([place]): CT-head showed age uncertain but  potentially recent/acute infarct in the mid right frontal lobe at the gray-white junction. Older appearing infarct involving a portion of the head of the caudate nucleus on the right in the immediately adjacent anterior limb of the right external capsule.  Message sent to Dr. Doy Mince for consultation.  Patient does not have significant weakness in the arms or legs on physical examination.  - will admit to tele bed  - will follow up Neurology's Recs.  - Obtain MRI/MRA  - Check carotid dopplers  - start ASA and lipitor - fasting lipid panel and HbA1c  - 2D transthoracic echocardiography  - swallowing screen. 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  I certify that at the point of admission it is my clinical judgment that the patient will require inpatient hospital care spanning beyond 2 midnights from the point of admission.  . This patient has multiple chronic comorbidities including stroke, former smoker, not seeing doctors for 20 years, . Now patient has presenting with acute/subacute stroke and hypoglycemia . The initial radiographic and laboratory data are worrisome because of CT-head findings of stroke and hypoglycemia . Current medical needs: please see my assessment and plan . Predictability of an adverse outcome (risk): Patient has multiple comorbidities, now presents with acute stroke.  His presentation is highly complicated.  Patient is at high risk of deteriorating.  Need to be treated in hospital for at least 2 days.        DVT ppx:  SQ Lovenox Code Status: Full code Family Communication: None at bed side.      Disposition Plan:  Anticipate discharge back to previous home environment Consults called:  Message sent to Dr. Doy Mince for consultation Admission status: med-surg for obs   Date of Service 09/01/2019    [facility]   If 7PM-7AM, please contact night-coverage [email or web address] Password [facility] 09/01/2019, 6:21 PM

## 2019-09-01 NOTE — ED Notes (Signed)
Patient transported to MRI 

## 2019-09-01 NOTE — ED Provider Notes (Signed)
[facility] Emergency Department Provider Note    First MD Initiated Contact with Patient 09/01/19 1025     (approximate)  I have reviewed the triage vital signs and the nursing notes.   HISTORY  Chief Complaint Weakness    HPI Eugene Strong is a 68 y.o. male with reported history of stroke presents the ER for evaluation of "feeling terrible "for the past week.  States he thinks he had another stroke roughly 1 week ago states that he was outside a lot fell due to right-sided weakness.  States he was held back in his house.  Did not seek medical attention.  Is not having a headache.  States has been having some sores and swelling in his mouth causing him to have decreased oral intake.  Denies any abdominal pain chest pain.  Denies blood thinners.  Does smoke pack cigarettes per day.  Denies any history of cancer.  Less known normal was over 1 week ago.   States he also had an episode over 1 month ago where he initially thought he had a stroke where he became weak on the right side and since then has had some slurred speech.  He did not seek medical attention after that event either.   Past Medical History:  Diagnosis Date  . Stroke [facility])    History reviewed. No pertinent family history. History reviewed. No pertinent surgical history. There are no problems to display for this patient.     Prior to Admission medications   Not on File    Allergies Codeine and Penicillins    Social History Social History   Tobacco Use  . Smoking status: Former Research scientist (life sciences)  . Smokeless tobacco: Never Used  Substance Use Topics  . Alcohol use: Not Currently  . Drug use: Not on file    Review of Systems Patient denies headaches, rhinorrhea, blurry vision, numbness, shortness of breath, chest pain, edema, cough, abdominal pain, nausea, vomiting, diarrhea, dysuria, fevers, rashes or hallucinations unless otherwise stated above in  HPI. ____________________________________________   PHYSICAL EXAM:  VITAL SIGNS: Vitals:   09/01/19 1100  BP: (!) 182/73  Pulse: 62  SpO2: 95%    Constitutional: Alert and oriented.  Eyes: Conjunctivae are normal.  Head: Atraumatic. Nose: No congestion/rhinnorhea. Mouth/Throat: Mucous membranes are moist.  Multiple large firm masses on the lower jaw with carious dentition.  No fluctuance.  Left aspect of the tongue is painful with granulated tissue.  No bleeding. Neck: No stridor. Painless ROM.  Cardiovascular: Normal rate, regular rhythm. Grossly normal heart sounds.  Tips of left hand are dusky,  Good radial and ulnar pulse Respiratory: Normal respiratory effort.  No retractions. Lungs CTAB. Gastrointestinal: Soft and nontender. No distention. No abdominal bruits. No CVA tenderness. Genitourinary:  Musculoskeletal: No lower extremity tenderness nor edema.  No joint effusions. Neurologic:  Normal speech and language. No gross focal neurologic deficits are appreciated. No facial droop, + left sided drift Skin:  Skin is warm, dry and intact. No rash noted. Psychiatric: Mood and affect are normal. Speech and behavior are normal.  ____________________________________________   LABS (all labs ordered are listed, but only abnormal results are displayed)  Results for orders placed or performed during the hospital encounter of 09/01/19 (from the past 24 hour(s))  Glucose, capillary     Status: Abnormal   Collection Time: 09/01/19 10:31 AM  Result Value Ref Range   Glucose-Capillary 45 (L) 70 - 99 mg/dL  Glucose, capillary  Status: Abnormal   Collection Time: 09/01/19 10:34 AM  Result Value Ref Range   Glucose-Capillary 35 (LL) 70 - 99 mg/dL  CBC     Status: Abnormal   Collection Time: 09/01/19 10:38 AM  Result Value Ref Range   WBC 7.5 4.0 - 10.5 K/uL   RBC 3.52 (L) 4.22 - 5.81 MIL/uL   Hemoglobin 12.4 (L) 13.0 - 17.0 g/dL   HCT 36.1 (L) 39.0 - 52.0 %   MCV 102.6 (H)  80.0 - 100.0 fL   MCH 35.2 (H) 26.0 - 34.0 pg   MCHC 34.3 30.0 - 36.0 g/dL   RDW 18.6 (H) 11.5 - 15.5 %   Platelets 305 150 - 400 K/uL   nRBC 0.0 0.0 - 0.2 %  Protime-INR     Status: None   Collection Time: 09/01/19 10:38 AM  Result Value Ref Range   Prothrombin Time 11.7 11.4 - 15.2 seconds   INR 0.9 0.8 - 1.2  APTT     Status: None   Collection Time: 09/01/19 10:38 AM  Result Value Ref Range   aPTT 32 24 - 36 seconds  Comprehensive metabolic panel     Status: Abnormal   Collection Time: 09/01/19 10:38 AM  Result Value Ref Range   Sodium 140 135 - 145 mmol/L   Potassium 4.1 3.5 - 5.1 mmol/L   Chloride 107 98 - 111 mmol/L   CO2 20 (L) 22 - 32 mmol/L   Glucose, Bld 107 (H) 70 - 99 mg/dL   BUN 23 8 - 23 mg/dL   Creatinine, Ser 0.91 0.61 - 1.24 mg/dL   Calcium 10.0 8.9 - 10.3 mg/dL   Total Protein 7.5 6.5 - 8.1 g/dL   Albumin 3.8 3.5 - 5.0 g/dL   AST 23 15 - 41 U/L   ALT 9 0 - 44 U/L   Alkaline Phosphatase 48 38 - 126 U/L   Total Bilirubin 0.6 0.3 - 1.2 mg/dL   GFR calc non Af Amer >60 >60 mL/min   GFR calc Af Amer >60 >60 mL/min   Anion gap 13 5 - 15  Differential     Status: None   Collection Time: 09/01/19 10:38 AM  Result Value Ref Range   Neutrophils Relative % 73 %   Neutro Abs 5.5 1.7 - 7.7 K/uL   Lymphocytes Relative 19 %   Lymphs Abs 1.5 0.7 - 4.0 K/uL   Monocytes Relative 5 %   Monocytes Absolute 0.4 0.1 - 1.0 K/uL   Eosinophils Relative 1 %   Eosinophils Absolute 0.1 0.0 - 0.5 K/uL   Basophils Relative 1 %   Basophils Absolute 0.0 0.0 - 0.1 K/uL   Immature Granulocytes 1 %   Abs Immature Granulocytes 0.06 0.00 - 0.07 K/uL  Glucose, capillary     Status: Abnormal   Collection Time: 09/01/19 10:45 AM  Result Value Ref Range   Glucose-Capillary 106 (H) 70 - 99 mg/dL  Ethanol     Status: None   Collection Time: 09/01/19 11:05 AM  Result Value Ref Range   Alcohol, Ethyl (B) <10 <10 mg/dL   ____________________________________________  EKG My review and  personal interpretation at Time: 10:37 Indication: weakness  Rate: 60  Rhythm: sinus Axis: normal Other: normal intervals, no stemi ____________________________________________  RADIOLOGY  I personally reviewed all radiographic images ordered to evaluate for the above acute complaints and reviewed radiology reports and findings.  These findings were personally discussed with the patient.  Please see medical record for  radiology report.  ____________________________________________   PROCEDURES  Procedure(s) performed:  Procedures    Critical Care performed: no ____________________________________________   INITIAL IMPRESSION / ASSESSMENT AND PLAN / ED COURSE  Pertinent labs & imaging results that were available during my care of the patient were reviewed by me and considered in my medical decision making (see chart for details).   DDX: dehdyration, hypoglycemia, sepsis, cva, malignancy, aki, dehydration, electrolyte abn,   MATEO SHAPIRO is a 68 y.o. who presents to the ED with symptoms as described above.  Patient arrives frail appearing with symptoms as described above.  Noted to be hypoglycemic to 30s.  Was given D50 and applesauce after swallow screen.  Patient outside of the window for code stroke symptoms seem to be subacute in nature but certainly concerning for stroke.  Blood work is reassuring.  CT imaging does have show evidence of recent infarct.  He is mildly hypertensive.  Will give IV fluids give aspirin and admit to the hospitalist for further medical management.     The patient was evaluated in Emergency Department today for the symptoms described in the history of present illness. He/she was evaluated in the context of the global COVID-19 pandemic, which necessitated consideration that the patient might be at risk for infection with the SARS-CoV-2 virus that causes COVID-19. Institutional protocols and algorithms that pertain to the evaluation of patients at risk for  COVID-19 are in a state of rapid change based on information released by regulatory bodies including the CDC and federal and state organizations. These policies and algorithms were followed during the patient's care in the ED.  As part of my medical decision making, I reviewed the following data within the [facility] notes reviewed and incorporated, Labs reviewed, notes from prior ED visits and [place] Controlled Substance Database   ____________________________________________   FINAL CLINICAL IMPRESSION(S) / ED DIAGNOSES  Final diagnoses:  Cerebrovascular accident (CVA), unspecified mechanism ([place])  Hypoglycemia  Weakness      NEW MEDICATIONS STARTED DURING THIS VISIT:  New Prescriptions   No medications on file     Note:  This document was prepared using Dragon voice recognition software and may include unintentional dictation errors.    Merlyn Lot, MD 09/01/19 1153

## 2019-09-02 ENCOUNTER — Inpatient Hospital Stay: Payer: Medicare Other

## 2019-09-02 ENCOUNTER — Inpatient Hospital Stay (HOSPITAL_COMMUNITY)
Admit: 2019-09-02 | Discharge: 2019-09-02 | Disposition: A | Discharge status: Home / Self Care | Payer: Medicare Other | Attending: Internal Medicine | Admitting: Internal Medicine

## 2019-09-02 DIAGNOSIS — I361 Nonrheumatic tricuspid (valve) insufficiency: Secondary | ICD-10-CM

## 2019-09-02 DIAGNOSIS — I6340 Cerebral infarction due to embolism of unspecified cerebral artery: Principal | ICD-10-CM

## 2019-09-02 LAB — GLUCOSE, CAPILLARY
Glucose-Capillary: 76 mg/dL (ref 70–99)
Glucose-Capillary: 72 mg/dL (ref 70–99)
Glucose-Capillary: 84 mg/dL (ref 70–99)
Glucose-Capillary: 79 mg/dL (ref 70–99)
Glucose-Capillary: 90 mg/dL (ref 70–99)
Glucose-Capillary: 79 mg/dL (ref 70–99)
Glucose-Capillary: 111 mg/dL — ABNORMAL HIGH (ref 70–99)
Glucose-Capillary: 20 mg/dL — CL (ref 70–99)
Glucose-Capillary: 88 mg/dL (ref 70–99)
Glucose-Capillary: 90 mg/dL (ref 70–99)

## 2019-09-02 LAB — ECHOCARDIOGRAM COMPLETE
Height: 74 in
Weight: 1440 oz

## 2019-09-02 LAB — LIPID PANEL
Cholesterol: 161 mg/dL (ref 0–200)
HDL: 49 mg/dL (ref >40)
LDL Cholesterol: 85 mg/dL (ref 0–99)
Total CHOL/HDL Ratio: 3.3 RATIO
Triglycerides: 137 mg/dL (ref <150)
VLDL: 27 mg/dL (ref 0–40)

## 2019-09-02 LAB — HEMOGLOBIN A1C
Hgb A1c MFr Bld: 6.6 % — ABNORMAL HIGH (ref 4.8–5.6)
Mean Plasma Glucose: 142.72 mg/dL

## 2019-09-02 LAB — GLUCOSE, POCT (MANUAL RESULT ENTRY): POC Glucose: 88 mg/dl (ref 70–99)

## 2019-09-02 LAB — HIV ANTIBODY (ROUTINE TESTING W REFLEX): HIV Screen 4th Generation wRfx: NONREACTIVE

## 2019-09-02 MED ORDER — INSULIN ASPART 100 UNIT/ML ~~LOC~~ SOLN
0.0000 [IU] | Freq: Three times a day (TID) | SUBCUTANEOUS | Status: DC
  Filled 2019-09-02: qty 0.06

## 2019-09-02 MED ORDER — ENOXAPARIN SODIUM 30 MG/0.3ML ~~LOC~~ SOLN
30.0000 mg | SUBCUTANEOUS | Status: DC
  Administered 2019-09-02: 30 mg via SUBCUTANEOUS
  Filled 2019-09-02 (×2): qty 0.3

## 2019-09-02 MED ORDER — CLOPIDOGREL BISULFATE 75 MG PO TABS
75.0000 mg | ORAL_TABLET | Freq: Every day | ORAL | Status: DC
  Administered 2019-09-02 – 2019-09-05 (×4): 75 mg via ORAL
  Filled 2019-09-02 (×4): qty 1

## 2019-09-02 MED ORDER — ASPIRIN EC 81 MG PO TBEC
81.0000 mg | DELAYED_RELEASE_TABLET | Freq: Every day | ORAL | Status: DC
  Administered 2019-09-03 – 2019-09-05 (×3): 81 mg via ORAL
  Filled 2019-09-02 (×3): qty 1

## 2019-09-02 MED ORDER — LIDOCAINE VISCOUS HCL 2 % MT SOLN
15.0000 mL | OROMUCOSAL | Status: DC | PRN
  Administered 2019-09-02 – 2019-09-03 (×2): 15 mL via OROMUCOSAL
  Filled 2019-09-02 (×4): qty 15

## 2019-09-02 MED ORDER — PNEUMOCOCCAL VAC POLYVALENT 25 MCG/0.5ML IJ INJ
0.5000 mL | INJECTION | INTRAMUSCULAR | Status: AC
  Administered 2019-09-03: 09:00:00 0.5 mL via INTRAMUSCULAR
  Filled 2019-09-02: qty 0.5

## 2019-09-02 MED ORDER — POLYETHYLENE GLYCOL 3350 17 G PO PACK
17.0000 g | PACK | Freq: Every day | ORAL | Status: DC
  Filled 2019-09-02: qty 1

## 2019-09-02 MED ORDER — MAGIC MOUTHWASH
15.0000 mL | Freq: Four times a day (QID) | ORAL | Status: DC
  Administered 2019-09-02 – 2019-09-05 (×15): 15 mL via ORAL
  Filled 2019-09-02 (×16): qty 15

## 2019-09-02 MED ORDER — ORAL CARE MOUTH RINSE
15.0000 mL | Freq: Two times a day (BID) | OROMUCOSAL | Status: DC
  Administered 2019-09-02 – 2019-09-05 (×6): 15 mL via OROMUCOSAL

## 2019-09-02 MED ORDER — INFLUENZA VAC A&B SA ADJ QUAD 0.5 ML IM PRSY
0.5000 mL | PREFILLED_SYRINGE | INTRAMUSCULAR | Status: AC
  Administered 2019-09-03: 0.5 mL via INTRAMUSCULAR
  Filled 2019-09-02: qty 0.5

## 2019-09-02 MED ORDER — ENOXAPARIN SODIUM 40 MG/0.4ML ~~LOC~~ SOLN
40.0000 mg | Freq: Every day | SUBCUTANEOUS | Status: DC
  Administered 2019-09-02: 40 mg via SUBCUTANEOUS
  Filled 2019-09-02 (×2): qty 0.4

## 2019-09-02 MED ORDER — BISACODYL 10 MG RE SUPP: 10.0000 mg | Freq: Once | RECTAL | Status: DC

## 2019-09-02 NOTE — Progress Notes (Signed)
[facility] Progress Note  Patient: Eugene Strong [id number]   PCP: Patient, No Pcp Per DOB: [birth year]   DOA: 09/01/2019   DOS: 09/02/2019   Date of Service: the patient was seen and examined on 09/02/2019  Chief Complaint  Patient presents with   Weakness   Brief hospital course: Pt. with PMH of CVA, smoker, weight loss; presented with complain of left-sided weakness, was found to have acute embolic CVA.  Currently further plan is for the stroke work-up.  Subjective: Does not have any acute complaint.  Reports throat pain.  No nausea no vomiting.  No fever no chills.  No chest pain.  No abdominal pain.  No diarrhea no constipation.  Minimal oral intake.  Assessment and Plan: Scheduled Meds:   stroke: mapping our early stages of recovery book   Does not apply Once   aspirin  324 mg Oral Daily   atorvastatin  80 mg Oral q1800   enoxaparin (LOVENOX) injection  30 mg Subcutaneous Q24H   feeding supplement (ENSURE ENLIVE)  237 mL Oral TID WC   magic mouthwash  15 mL Oral QID   Continuous Infusions: PRN Meds: acetaminophen **OR** acetaminophen (TYLENOL) oral liquid 160 mg/5 mL **OR** acetaminophen, dextrose, hydrALAZINE, lidocaine, ondansetron (ZOFRAN) IV, senna-docusate  1.  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 Out of the window For any TPA. PT OT consult pending. Speech therapy consult pending. Patient passed swallow evaluation. We will advance diet. Permissive hypertension. Aspirin and Plavix for 3 weeks followed by aspirin alone. LDL 85 add Lipitor 80 mg. Monitor recommendation from therapist  2.  Hypoglycemia. Type 2 diabetes mellitus based on hemoglobin A1c New onset diabetes is the patient never has seen a doctor for last 20 years. Patient actually was hypoglycemic likely from poor p.o. intake. Hemoglobin A1c currently down to 6.6. Will benefit from being on Metformin. Add sensitive  sliding scale.  3.  Protein calorie malnutrition/suspected Underweight Body mass index is 11.56 kg/m.  Dietary consultation. Likely from poor p.o. intake from mandibular tori.  4.  Throat pain.   Etiology not clear.  No erythema on the pharynx identified at the time of examination. Add Magic mouthwash on scheduled basis. Lidocaine mouthwash as needed.  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 CT maxillofacial suggest dense bony overgrowth from the anterior mandible bilaterally suggesting mandibular tori. Has encroachment on the floor of the mouth as well as deviates the anterior tongue as well. Likely causing poor p.o. intake and possible resultant malnutrition. We will need to arrange ENT follow-up outpatient.  6.  Flatulence. Ileus. Constipation Patient reported significant Flatulence and diarrhea X-ray abdomen shows evidence of scattered gas-filled bowel loops with mild to moderate distention. There is also stool in the rectum and scattered loops of the gas-filled colon. We will provide suppository x1. Since the patient does not have any vomiting continue with a diet for now.  Diet: Regular diet  DVT Prophylaxis: Subcutaneous Lovenox   Advance goals of care discussion: Full code  Family Communication: no family was present at bedside, at the time of interview.   Disposition:  Discharge to home.  Consultants: Neurology Procedures: Echocardiogram   Antibiotics: Anti-infectives (From admission, onward)   None       Objective: Physical Exam: Vitals:   09/01/19 2359 09/02/19 0738 09/02/19 0748 09/02/19 1206  BP: (!) 146/72 (!) 154/65  135/60  Pulse: (!) 57 (!) 36  69  Resp: 14 (!)  21  18  Temp:  (!) 97.5 F (36.4 C)    TempSrc:  Oral    SpO2: 100%  100% 96%  Weight:      Height:        Intake/Output Summary (Last 24 hours) at 09/02/2019 1530 Last data filed at 09/02/2019 1204 Gross per 24 hour  Intake 296.41 ml  Output  200 ml  Net 96.41 ml   Filed Weights   09/01/19 1028  Weight: 40.8 kg   General: Cachectic appearing gentleman, alert and oriented to time, place, and person. Appear in mild distress, affect appropriate  Eyes: PERRL, Conjunctiva normal ENT: Oral Mucosa Clear, moist lower palate has hard bony lesion Neck: no JVD, no Abnormal Mass Or lumps Cardiovascular: S1 and S2 Present, no Murmur,  Respiratory: good respiratory effort, Bilateral Air entry equal and Decreased, no signs of accessory muscle use, Clear to Auscultation, no Crackles, no wheezes Abdomen: Bowel Sound present, Soft and no tenderness, no hernia Skin: no rashes  Extremities: no Pedal edema, no calf tenderness Neurologic: mental status, alert and oriented x3, speech normal, PERLA, Motor strength reduced at right upper, right lower and Positive pronator drift on the right extremity, Sensation grossly normal to light touch, Reflex difficult to assess and Finger to nose test normal bilaterally  Gait not checked due to patient safety concerns  Data Reviewed: I have personally reviewed and interpreted daily labs, tele strips, imagings as discussed above. I reviewed all nursing notes, pharmacy notes, vitals, pertinent old records I have discussed plan of care as described above with RN and patient/family.  CBC: Recent Labs  Lab 09/01/19 1038  WBC 7.5  NEUTROABS 5.5  HGB 12.4*  HCT 36.1*  MCV 102.6*  PLT [id number]   Basic Metabolic Panel: Recent Labs  Lab 09/01/19 1038  NA 140  K 4.1  CL 107  CO2 20*  GLUCOSE 107*  BUN 23  CREATININE 0.91  CALCIUM 10.0    Liver Function Tests: Recent Labs  Lab 09/01/19 1038  AST 23  ALT 9  ALKPHOS 48  BILITOT 0.6  PROT 7.5  ALBUMIN 3.8   No results for input(s): LIPASE, AMYLASE in the last 168 hours. No results for input(s): AMMONIA in the last 168 hours. Coagulation Profile: Recent Labs  Lab 09/01/19 1038  INR 0.9   Cardiac Enzymes: No results for input(s): CKTOTAL,  CKMB, CKMBINDEX, TROPONINI in the last 168 hours. BNP (last 3 results) No results for input(s): PROBNP in the last 8760 hours. CBG: Recent Labs  Lab 09/02/19 0744 09/02/19 0932 09/02/19 1058 09/02/19 1308 09/02/19 1513  GLUCAP 84 79 90 79 111*   Studies: MR ANGIO HEAD WO CONTRAST  Result Date: 09/01/2019 CLINICAL DATA:  Initial evaluation for focal neural deficit, possible stroke. EXAM: MRI HEAD WITHOUT CONTRAST MRA HEAD WITHOUT CONTRAST TECHNIQUE: Multiplanar, multiecho pulse sequences of the brain and surrounding structures were obtained without intravenous contrast. Angiographic images of the head were obtained using MRA technique without contrast. COMPARISON:  Prior CT from earlier the same day. FINDINGS: MRI HEAD FINDINGS Brain: Diffuse prominence of the CSF containing spaces compatible with generalized age-related cerebral atrophy. Patchy T2/FLAIR hyperintensity within the periventricular deep white matter both cerebral hemispheres most consistent with chronic small vessel ischemic disease, mild to moderate nature. Few scattered superimposed remote lacunar infarcts seen involving the bilateral thalami, basal ganglia, and hemispheric cerebral white matter. Small remote cortical/subcortical infarct noted involving the anterior right frontal lobe (series 20, image 32). Punctate 3 mm focus of  restricted diffusion seen involving the cortex of the left parietal lobe, consistent with a small acute ischemic infarct (series 5, image 36). Additional faint diffusion abnormality seen involving the cortex of the left occipital lobe (series 5, image 22), acute to subacute in appearance. No associated hemorrhage or mass effect. No other abnormal foci of restricted diffusion to suggest acute or subacute ischemia. Gray-white matter differentiation otherwise maintained. No acute intracranial hemorrhage. Single subcentimeter focus of susceptibility artifact noted within left parietal lobe, consistent with a  small microhemorrhage, likely small vessel related. No mass lesion, midline shift or mass effect. No hydrocephalus. No extra-axial fluid collection. Pituitary gland suprasellar region within normal limits. Midline structures intact and normal. Vascular: Major intracranial vascular flow voids are maintained. Skull and upper cervical spine: Craniocervical junction within normal limits. Bone marrow signal intensity normal. No scalp soft tissue abnormality. Sinuses/Orbits: Globes and orbital soft tissues within normal limits. Paranasal sinuses are largely clear. Right-to-left nasal septal deviation with associated concha bullosa noted. 8 mm well-circumscribed T2 hyperintense nodule at the left nasal bridge, indeterminate (series 15, image 7). Other: No mastoid effusion.  Inner ear structures grossly normal. MRA HEAD FINDINGS ANTERIOR CIRCULATION: Visualized distal cervical segments of the internal carotid arteries are widely patent with symmetric antegrade flow. Petrous segments widely patent. Multifocal atheromatous irregularity throughout the cavernous/supraclinoid ICAs without high-grade flow-limiting stenosis, slightly worse on the right. ICA termini well perfused. Right A1 widely patent. Short-segment severe stenosis noted at the proximal left M1 segment (series 9, image 106). Normal anterior communicating artery. Anterior cerebral arteries widely patent to their distal aspects without high-grade stenosis. No M1 stenosis or occlusion. Normal MCA bifurcations. Distal MCA branches well perfused and symmetric, although demonstrate diffuse small vessel atheromatous irregularity. POSTERIOR CIRCULATION: Dominant left vertebral artery widely patent to the vertebrobasilar junction. Patent left PICA. Right vertebral artery diffusely hypoplastic and not well seen, although there is flow related signal within the distal right V4 segment with perfusion of the right PICA. Basilar widely patent to its distal aspect without  stenosis. Superior cerebral arteries patent bilaterally. Left PCA is supplied primarily via the basilar. Right PCA supplied via the basilar as well as a robust right posterior communicating artery. Multifocal atheromatous irregularity throughout both PCAs, which are both patent to their distal aspects. Short-segment severe distal right P3 stenosis noted (series 1059, image 6). No intracranial aneurysm or other vascular abnormality. IMPRESSION: MRI HEAD IMPRESSION: 1. Few tiny subcentimeter acute to early subacute cortical infarcts involving the left parietal and occipital lobes as above. No associated hemorrhage. 2. Underlying age-related cerebral atrophy with moderate chronic microvascular ischemic disease. 3. 8 mm nodule involving the skin of the left nasal bridge, indeterminate. Correlation with physical exam recommended. MRA HEAD IMPRESSION: 1. No large vessel occlusion. 2. 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 No other proximal high-grade or correctable stenosis. Electronically Signed   By: Jeannine Boga M.D.   On: 09/01/2019 22:01   MR BRAIN WO CONTRAST  Result Date: 09/01/2019 CLINICAL DATA:  Initial evaluation for focal neural deficit, possible stroke. EXAM: MRI HEAD WITHOUT CONTRAST MRA HEAD WITHOUT CONTRAST TECHNIQUE: Multiplanar, multiecho pulse sequences of the brain and surrounding structures were obtained without intravenous contrast. Angiographic images of the head were obtained using MRA technique without contrast. COMPARISON:  Prior CT from earlier the same day. FINDINGS: MRI HEAD FINDINGS Brain: Diffuse prominence of the CSF containing spaces compatible with generalized age-related cerebral atrophy. Patchy T2/FLAIR hyperintensity within the periventricular deep  white matter both cerebral hemispheres most consistent with chronic small vessel ischemic disease, mild to moderate nature. Few scattered superimposed remote  lacunar infarcts seen involving the bilateral thalami, basal ganglia, and hemispheric cerebral white matter. Small remote cortical/subcortical infarct noted involving the anterior right frontal lobe (series 20, image 32). Punctate 3 mm focus of restricted diffusion seen involving the cortex of the left parietal lobe, consistent with a small acute ischemic infarct (series 5, image 36). Additional faint diffusion abnormality seen involving the cortex of the left occipital lobe (series 5, image 22), acute to subacute in appearance. No associated hemorrhage or mass effect. No other abnormal foci of restricted diffusion to suggest acute or subacute ischemia. Gray-white matter differentiation otherwise maintained. No acute intracranial hemorrhage. Single subcentimeter focus of susceptibility artifact noted within left parietal lobe, consistent with a small microhemorrhage, likely small vessel related. No mass lesion, midline shift or mass effect. No hydrocephalus. No extra-axial fluid collection. Pituitary gland suprasellar region within normal limits. Midline structures intact and normal. Vascular: Major intracranial vascular flow voids are maintained. Skull and upper cervical spine: Craniocervical junction within normal limits. Bone marrow signal intensity normal. No scalp soft tissue abnormality. Sinuses/Orbits: Globes and orbital soft tissues within normal limits. Paranasal sinuses are largely clear. Right-to-left nasal septal deviation with associated concha bullosa noted. 8 mm well-circumscribed T2 hyperintense nodule at the left nasal bridge, indeterminate (series 15, image 7). Other: No mastoid effusion.  Inner ear structures grossly normal. MRA HEAD FINDINGS ANTERIOR CIRCULATION: Visualized distal cervical segments of the internal carotid arteries are widely patent with symmetric antegrade flow. Petrous segments widely patent. Multifocal atheromatous irregularity throughout the cavernous/supraclinoid ICAs without  high-grade flow-limiting stenosis, slightly worse on the right. ICA termini well perfused. Right A1 widely patent. Short-segment severe stenosis noted at the proximal left M1 segment (series 9, image 106). Normal anterior communicating artery. Anterior cerebral arteries widely patent to their distal aspects without high-grade stenosis. No M1 stenosis or occlusion. Normal MCA bifurcations. Distal MCA branches well perfused and symmetric, although demonstrate diffuse small vessel atheromatous irregularity. POSTERIOR CIRCULATION: Dominant left vertebral artery widely patent to the vertebrobasilar junction. Patent left PICA. Right vertebral artery diffusely hypoplastic and not well seen, although there is flow related signal within the distal right V4 segment with perfusion of the right PICA. Basilar widely patent to its distal aspect without stenosis. Superior cerebral arteries patent bilaterally. Left PCA is supplied primarily via the basilar. Right PCA supplied via the basilar as well as a robust right posterior communicating artery. Multifocal atheromatous irregularity throughout both PCAs, which are both patent to their distal aspects. Short-segment severe distal right P3 stenosis noted (series 1059, image 6). No intracranial aneurysm or other vascular abnormality. IMPRESSION: MRI HEAD IMPRESSION: 1. Few tiny subcentimeter acute to early subacute cortical infarcts involving the left parietal and occipital lobes as above. No associated hemorrhage. 2. Underlying age-related cerebral atrophy with moderate chronic microvascular ischemic disease. 3. 8 mm nodule involving the skin of the left nasal bridge, indeterminate. Correlation with physical exam recommended. MRA HEAD IMPRESSION: 1. No large vessel occlusion. 2. 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 No other proximal high-grade or correctable stenosis. Electronically Signed   By: Jeannine Boga M.D.   On: 09/01/2019 22:01   US Carotid Bilateral (at [facility] only)  Result Date: 09/02/2019 CLINICAL DATA:  Left-sided weakness and cerebral infarction. EXAM: BILATERAL CAROTID DUPLEX ULTRASOUND TECHNIQUE: Pearline Cables scale imaging, color Doppler and duplex ultrasound  were performed of bilateral carotid and vertebral arteries in the neck. COMPARISON:  None. FINDINGS: Criteria: Quantification of carotid stenosis is based on velocity parameters that correlate the residual internal carotid diameter with NASCET-based stenosis levels, using the diameter of the distal internal carotid lumen as the denominator for stenosis measurement. The following velocity measurements were obtained: RIGHT ICA:  154/51 distal, 69/18 proximal cm/sec CCA:  0000000 cm/sec SYSTOLIC ICA/CCA RATIO:  1.8 ECA:  87 cm/sec LEFT ICA:  390/133 cm/sec CCA:  [id number] cm/sec SYSTOLIC ICA/CCA RATIO:  7.3 ECA:  99 cm/sec RIGHT CAROTID ARTERY: Moderate calcified plaque in the distal common carotid artery, carotid bulb and internal carotid artery. Estimated proximal right ICA stenosis is less than 50%. RIGHT VERTEBRAL ARTERY: Antegrade flow with normal waveform and velocity. LEFT CAROTID ARTERY: Severe amount of predominately calcified plaque is present at the level of the distal bulb and proximal left ICA. Turbulent flow and elevated velocities correspond to an estimated greater than 70% left ICA stenosis. LEFT VERTEBRAL ARTERY: Antegrade flow with normal waveform and velocity. IMPRESSION: 1. Significant estimated greater than 70% proximal left ICA stenosis. 2. Estimated less than 50% proximal right ICA stenosis. Electronically Signed   By: Aletta Edouard M.D.   On: 09/02/2019 13:40   DG Abd Portable 1V  Result Date: 09/02/2019 CLINICAL DATA:  Constipation, intermittent constipation. EXAM: PORTABLE ABDOMEN - 1 VIEW COMPARISON:  None. FINDINGS: Lung bases are clear. No signs of free air on supine radiography. Scattered gas-filled loops of  small bowel with mild distension to moderate distension. Gas and stool in the rectum and scattered loops of gas-filled colon. No acute bone finding. IMPRESSION: Findings most suggestive of global ileus. Early partial small bowel obstruction might have a similar appearance, consider continued follow-up for further assessment. Electronically Signed   By: Zetta Bills M.D.   On: 09/02/2019 10:34   ECHOCARDIOGRAM COMPLETE  Result Date: 09/02/2019   ECHOCARDIOGRAM REPORT   Patient Name:   DVON KUDELKA Date of Exam: 09/02/2019 Medical Rec #:  [id number]      Height:       74.0 in Accession #:    [id number]     Weight:       90.0 lb Date of Birth:  [birth year]      BSA:          1.55 m Patient Age:    68 years       BP:           146/72 mmHg Patient Gender: M              HR:           67 bpm. Exam Location:  [facility] Procedure: 2D Echo Indications:     Stroke 434.91/I163.9  History:         Patient has no prior history of Echocardiogram examinations.                  Risk Factors:Hypertension.  Sonographer:     Avanell Shackleton Referring Phys:  Unknown Foley NIU Diagnosing Phys: Skeet Latch MD  Sonographer Comments: Technically difficult study due to poor echo windows. IMPRESSIONS  1. Left ventricular ejection fraction, by visual estimation, is 60 to 65%. The left ventricle has normal function. Left ventricular septal wall thickness was normal. Normal left ventricular posterior wall thickness. There is no left ventricular hypertrophy.  2. Left ventricular diastolic parameters are consistent with Grade II diastolic dysfunction (pseudonormalization).  3. The left ventricle has no regional  wall motion abnormalities.  4. Global right ventricle has normal systolic function.The right ventricular size is normal. No increase in right ventricular wall thickness.  5. Left atrial size was normal.  6. Right atrial size was normal.  7. The mitral valve is normal in structure. No evidence of mitral valve regurgitation. No evidence  of mitral stenosis.  8. The tricuspid valve is normal in structure. Tricuspid valve regurgitation is mild.  9. The aortic valve is normal in structure. Aortic valve regurgitation is not visualized. No evidence of aortic valve sclerosis or stenosis. 10. The pulmonic valve was normal in structure. Pulmonic valve regurgitation is not visualized. 11. Normal pulmonary artery systolic pressure. 12. The inferior vena cava is normal in size with greater than 50% respiratory variability, suggesting right atrial pressure of 3 mmHg. FINDINGS  Left Ventricle: Left ventricular ejection fraction, by visual estimation, is 60 to 65%. The left ventricle has normal function. The left ventricle has no regional wall motion abnormalities. Normal left ventricular posterior wall thickness. There is no left ventricular hypertrophy. Left ventricular diastolic parameters are consistent with Grade II diastolic dysfunction (pseudonormalization). Indeterminate filling pressures. Right Ventricle: The right ventricular size is normal. No increase in right ventricular wall thickness. Global RV systolic function is has normal systolic function. The tricuspid regurgitant velocity is 2.43 m/s, and with an assumed right atrial pressure  of 3 mmHg, the estimated right ventricular systolic pressure is normal at 26.7 mmHg. Left Atrium: Left atrial size was normal in size. Right Atrium: Right atrial size was normal in size Pericardium: There is no evidence of pericardial effusion. Mitral Valve: The mitral valve is normal in structure. No evidence of mitral valve regurgitation. No evidence of mitral valve stenosis by observation. Tricuspid Valve: The tricuspid valve is normal in structure. Tricuspid valve regurgitation is mild. Aortic Valve: The aortic valve is normal in structure. Aortic valve regurgitation is not visualized. The aortic valve is structurally normal, with no evidence of sclerosis or stenosis. Pulmonic Valve: The pulmonic valve was normal  in structure. Pulmonic valve regurgitation is not visualized. Pulmonic regurgitation is not visualized. Aorta: The aortic root, ascending aorta and aortic arch are all structurally normal, with no evidence of dilitation or obstruction. Venous: The inferior vena cava is normal in size with greater than 50% respiratory variability, suggesting right atrial pressure of 3 mmHg. IAS/Shunts: No atrial level shunt detected by color flow Doppler. There is no evidence of a patent foramen ovale. No ventricular septal defect is seen or detected. There is no evidence of an atrial septal defect.  LEFT VENTRICLE PLAX 2D LVIDd:         2.54 cm  Diastology LVIDs:         1.25 cm  LV e' lateral:   7.62 cm/s LV PW:         0.90 cm  LV E/e' lateral: 9.6 LV IVS:        0.84 cm  LV e' medial:    6.20 cm/s LVOT diam:     2.00 cm  LV E/e' medial:  11.7 LV SV:         19 ml LV SV Index:   13.70 LVOT Area:     3.14 cm  RIGHT VENTRICLE             IVC RV S prime:     13.40 cm/s  IVC diam: 1.30 cm TAPSE (M-mode): 2.1 cm LEFT ATRIUM  Index       RIGHT ATRIUM           Index LA diam:        2.30 cm 1.49 cm/m  RA Area:     10.30 cm LA Vol (A2C):   29.0 ml 18.73 ml/m RA Volume:   21.90 ml  14.15 ml/m LA Vol (A4C):   14.8 ml 9.56 ml/m LA Biplane Vol: 22.3 ml 14.41 ml/m   AORTA Ao Root diam: 3.20 cm MITRAL VALVE                        TRICUSPID VALVE MV Area (PHT): 3.37 cm             TR Peak grad:   23.7 mmHg MV PHT:        65.25 msec           TR Vmax:        273.00 cm/s MV Decel Time: 225 msec MV E velocity: 72.80 cm/s 103 cm/s  SHUNTS MV A velocity: 58.20 cm/s 70.3 cm/s Systemic Diam: 2.00 cm MV E/A ratio:  1.25       1.5  Skeet Latch MD Electronically signed by Skeet Latch MD Signature Date/Time: 09/02/2019/2:32:21 PM    Final      Time spent: 35 minutes  Author: Berle Mull, MD [facility] 09/02/2019 3:30 PM  To reach On-call, see care teams to locate the attending and reach out to them via  [email or web address]. If 7PM-7AM, please contact night-coverage If you still have difficulty reaching the attending provider, please page the [facility] (Director on Call) for [facility] on amion for assistance.

## 2019-09-02 NOTE — Progress Notes (Signed)
Nsg completing assessment at present. Chart reviewed. Pt is atking PO's with difficltuy per nsg noted, passes swallow screen. Able to communicate needs and wants. Noted Pt came in with slurred speech and acute embolic stroke. Will revisit to determine if Pt has any speech deficits or if speech is back to baseline.

## 2019-09-02 NOTE — ED Notes (Signed)
Admitting MD at bedside.

## 2019-09-02 NOTE — ED Notes (Signed)
Pt given apple juice  

## 2019-09-02 NOTE — ED Notes (Signed)
CBG 79, pt eating lunch tray at this time and drinking chocolate milk.

## 2019-09-02 NOTE — Progress Notes (Signed)
Enoxaparin - Dose Adjustment  Estimated Creatinine Clearance: 44.2 mL/min (by C-G formula based on SCr of 0.91 mg/dL).   Last Weight  Most recent update: 09/01/2019 10:29 AM   Weight  40.8 kg (90 lb)           Patient with acute embolic stroke, per Dr. Doy Mince okay to continue anticoagulation for DVT prophylaxis. Current orders for enoxaparin 40mg  SQ Q24H, will adjust enoxaparin to 30mg  SQ Q24 for patient wieght < 45kg.  Rexene Edison, PharmD, BCPS Clinical Pharmacist 09/02/2019

## 2019-09-02 NOTE — Progress Notes (Signed)
Neuro checks no change since baseline at 1720 this shift

## 2019-09-02 NOTE — Progress Notes (Signed)
OT Cancellation Note  Patient Details Name: Eugene Strong MRN: [id number] DOB: June 16, 1951   Cancelled Treatment:    Reason Eval/Treat Not Completed: Patient declined, no reason specified. Consult received, chart reviewed. Upon attempt, pt lying on his side, keeps eyes closed and does not respond to OT despite verbal and tactile cues. Per PT, pt very fatigued and requested rest during PT assessment. Will re-attempt at later date/time as pt is agreeable and medically appropriate.   Jeni Salles, MPH, MS, OTR/L ascom [phone] 09/02/19, 3:12 PM

## 2019-09-02 NOTE — Evaluation (Addendum)
Physical Therapy Evaluation Patient Details Name: Eugene Strong MRN: [id number] DOB: 31-Jan-1951 Today's Date: 09/02/2019   History of Present Illness  Pt is a 68 year old M who comes to the ED with L side weakness and slurred speech.  Imaging revealed  acute embolic left parietal occipital CVA and pt is generally weak but symptoms have mostly resolved.  [name] includes CVA affecting L side.  Clinical Impression  Pt is a 68 year old M who lives in a single story home alone.  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  Pt asleep when PT entered room and reluctantly agreed to participate.  He was able to sit up with min hand held assist and supervision while at EOB, though he reported some "dizziness" which resolved quickly.  Pt was able to stand from bedside without UE assistance and walk along EOB, though he asked to return to bed, stating he felt "dizzy" again.  Pt quickly returned to bed.  He presented with overall weakness and appears very frail; was agreeable that he needs therapy to regain strength.  He did not present with significant weakness of L side vs R side and is R hand dominant.  UE/LE coordination testing revealed no deficits.  Pt will continue to benefit from skilled PT with focus on strength, tolerance to activity and safe functional mobility.  He may benefit from a [facility] for longer distance walking.    Follow Up Recommendations Home health PT;Supervision - Intermittent    Equipment Recommendations  Cane(May need a SPC for shorter distances.)    Recommendations for Other Services       Precautions / Restrictions Precautions Precautions: Fall Restrictions Weight Bearing Restrictions: No      Mobility  Bed Mobility Overal bed mobility: Needs Assistance Bed Mobility: Supine to Sit;Sit to Supine     Supine to sit: Min assist Sit to supine: Supervision   General bed mobility comments: Hand held assist to bring trunk upright.  Pt generally weak and stated that he  was not able to sleep much last night.  Transfers Overall transfer level: Needs assistance Equipment used: None Transfers: Sit to/from Stand Sit to Stand: Min guard         General transfer comment: Able to stand from higher bed surface without assistance to elevate.  Stood for 2 min, reported feeling some "dizziness" and wanted to return to bed.  Ambulation/Gait Ambulation/Gait assistance: Independent Gait Distance (Feet): 5 Feet       Gait velocity interpretation: <1.8 ft/sec, indicate of risk for recurrent falls General Gait Details: Pt very weak but able to walk along bedside to return to bed without UE assistance.  Low foot clearance and decreased step length.  Stairs            Wheelchair Mobility    Modified Rankin (Stroke Patients Only)       Balance Overall balance assessment: Independent;Mild deficits observed, not formally tested(May benefit from a [facility].)                                           Pertinent Vitals/Pain Pain Assessment: No/denies pain    Home Living Family/patient expects to be discharged to:: Private residence Living Arrangements: Alone Available Help at Discharge: Family;Friend(s);Available PRN/intermittently Type of Home: House Home Access: Stairs to enter Entrance Stairs-Rails: None Entrance Stairs-Number of Steps: 1 Home Layout: One  level Home Equipment: Shower seat - built in      Prior Function Level of Independence: Independent         Comments: Able to ambulate without AD, no assistance with bathing, dressing, etc.     Hand Dominance   Dominant Hand: Right    Extremity/Trunk Assessment   Upper Extremity Assessment Upper Extremity Assessment: Generalized weakness(L UE: grossly 3+/5, R UE: grossly 4-/5.)    Lower Extremity Assessment Lower Extremity Assessment: Generalized weakness(B LE: ankle DF/PF, knee flex/ext: 4-/5, hip flexion: 3+/5.)    Cervical / Trunk Assessment Cervical / Trunk  Assessment: Kyphotic(Mild)  Communication   Communication: No difficulties  Cognition Arousal/Alertness: Awake/alert Behavior During Therapy: Restless Overall Cognitive Status: Within Functional Limits for tasks assessed                                 General Comments: A&O to self, situation and location.  Follows commands consistently.      General Comments      Exercises     Assessment/Plan    PT Assessment Patient needs continued PT services  PT Problem List Decreased strength;Decreased mobility;Decreased activity tolerance;Decreased balance       PT Treatment Interventions DME instruction;Therapeutic exercise;Gait training;Balance training;Stair training;Functional mobility training;Therapeutic activities;Patient/family education    PT Goals (Current goals can be found in the Care Plan section)  Acute Rehab PT Goals Patient Stated Goal: To return home and work with therapy so he can be stronger. PT Goal Formulation: With patient Time For Goal Achievement: 09/16/19 Potential to Achieve Goals: Good    Frequency 7X/week   Barriers to discharge        Co-evaluation               AM-PAC PT "6 Clicks" Mobility  Outcome Measure Help needed turning from your back to your side while in a flat bed without using bedrails?: None Help needed moving from lying on your back to sitting on the side of a flat bed without using bedrails?: A Little Help needed moving to and from a bed to a chair (including a wheelchair)?: A Little Help needed standing up from a chair using your arms (e.g., wheelchair or bedside chair)?: A Little Help needed to walk in hospital room?: A Little Help needed climbing 3-5 steps with a railing? : A Little 6 Click Score: 19    End of Session Equipment Utilized During Treatment: Gait belt Activity Tolerance: Patient limited by fatigue Patient left: in bed;with call bell/phone within reach   PT Visit Diagnosis: Unsteadiness on feet  (R26.81);Muscle weakness (generalized) (M62.81);Difficulty in walking, not elsewhere classified (R26.2)    Time: [id number] PT Time Calculation (min) (ACUTE ONLY): 17 min   Charges:   PT Evaluation $PT Eval Low Complexity: 1 Low          Roxanne Gates, PT, DPT   Roxanne Gates 09/02/2019, 4:17 PM

## 2019-09-02 NOTE — Consult Note (Signed)
Referring Physician: Posey Pronto    Chief Complaint: Left sided weakness  HPI: Eugene Strong is an 68 y.o. male who is a poor historian.  Reports that he has been weaker for the past 2 months.  Has also had some intermittent spells when he gets significantly weaker but it seems to resolve on its own.  On yesterday he reports that he became weak to the point that he was unable to ambulate.  Came in for evaluation at that time.    Date last known well: Unable to determine Time last known well: Unable to determine tPA Given: No: Unable to determine LKW  Past Medical History:  Diagnosis Date  . Stroke [facility])     History reviewed. No pertinent surgical history.  Family History  Problem Relation Age of Onset  . Goiter Father    Social History:  reports that he has quit smoking. He has never used smokeless tobacco. He reports previous alcohol use. He reports that he does not use drugs.  Allergies:  Allergies  Allergen Reactions  . Codeine   . Penicillins     Medications:  I have reviewed the patient's current medications. Prior to Admission:  Prior to Admission medications   Not on File     Scheduled: .  stroke: mapping our early stages of recovery book   Does not apply Once  . aspirin  324 mg Oral Daily  . atorvastatin  80 mg Oral q1800  . enoxaparin (LOVENOX) injection  40 mg Subcutaneous Daily  . feeding supplement (ENSURE ENLIVE)  237 mL Oral TID WC  . magic mouthwash  15 mL Oral QID    ROS: History obtained from the patient  General ROS: negative for - chills, fatigue, fever, night sweats, weight gain or weight loss Psychological ROS: negative for - behavioral disorder, hallucinations, memory difficulties, mood swings or suicidal ideation Ophthalmic ROS: negative for - blurry vision, double vision, eye pain or loss of vision ENT ROS: tooth pain Allergy and Immunology ROS: negative for - hives or itchy/watery eyes Hematological and Lymphatic ROS: negative for - bleeding  problems, bruising or swollen lymph nodes Endocrine ROS: negative for - galactorrhea, hair pattern changes, polydipsia/polyuria or temperature intolerance Respiratory ROS: negative for - cough, hemoptysis, shortness of breath or wheezing Cardiovascular ROS: negative for - chest pain, dyspnea on exertion, edema or irregular heartbeat Gastrointestinal ROS: negative for - abdominal pain, diarrhea, hematemesis, nausea/vomiting or stool incontinence Genito-Urinary ROS: negative for - dysuria, hematuria, incontinence or urinary frequency/urgency Musculoskeletal ROS: negative for - joint swelling or muscular weakness Neurological ROS: as noted in HPI Dermatological ROS: negative for rash and skin lesion changes  Physical Examination: Blood pressure (!) 154/65, pulse (!) 36, temperature (!) 97.5 F (36.4 C), temperature source Oral, resp. rate (!) 21, height 6\' 2"  (1.88 m), weight 40.8 kg, SpO2 100 %.  HEENT-  Normocephalic, no lesions, without obvious abnormality.  Normal external eye and conjunctiva.  Normal TM's bilaterally.  Normal auditory canals and external ears. Normal external nose, mucus membranes and septum.  Normal pharynx. Cardiovascular- S1, S2 normal, pulses palpable throughout   Lungs- chest clear, no wheezing, rales, normal symmetric air entry Abdomen- soft, non-tender; bowel sounds normal; no masses,  no organomegaly Extremities- no edema Lymph-no adenopathy palpable Musculoskeletal-no joint tenderness, deformity or swelling Skin-warm and dry, no hyperpigmentation, vitiligo, or suspicious lesions  Neurological Examination   Mental Status: Alert.  Tangential. At times behavior inappropriate.  Speech fluent without evidence of aphasia.  Able to follow  3 step commands without difficulty. Cranial Nerves: II: Visual fields grossly normal, pupils equal, round, reactive to light and accommodation III,IV, VI: ptosis not present, extra-ocular motions intact bilaterally V,VII: smile  symmetric, facial light touch sensation normal bilaterally VIII: hearing normal bilaterally IX,X: gag reflex present XI: bilateral shoulder shrug XII: midline tongue extension Motor: Right : Upper extremity   5/5    Left:     Upper extremity   5/5  Lower extremity   4/5     Lower extremity   5/5 Tone and bulk:normal tone throughout; no atrophy noted Sensory: Pinprick and light touch intact throughout, bilaterally Deep Tendon Reflexes: Symmetric throughout Plantars: Right: mute   Left: mute Cerebellar: Normal finger-to-nose and normal heel-to-shin testing bilaterally Gait: not tested due to safety concerns    Laboratory Studies:  Basic Metabolic Panel: Recent Labs  Lab 09/01/19 1038  NA 140  K 4.1  CL 107  CO2 20*  GLUCOSE 107*  BUN 23  CREATININE 0.91  CALCIUM 10.0    Liver Function Tests: Recent Labs  Lab 09/01/19 1038  AST 23  ALT 9  ALKPHOS 48  BILITOT 0.6  PROT 7.5  ALBUMIN 3.8   No results for input(s): LIPASE, AMYLASE in the last 168 hours. No results for input(s): AMMONIA in the last 168 hours.  CBC: Recent Labs  Lab 09/01/19 1038  WBC 7.5  NEUTROABS 5.5  HGB 12.4*  HCT 36.1*  MCV 102.6*  PLT 305    Cardiac Enzymes: No results for input(s): CKTOTAL, CKMB, CKMBINDEX, TROPONINI in the last 168 hours.  BNP: Invalid input(s): POCBNP  CBG: Recent Labs  Lab 09/01/19 2141 09/01/19 2340 09/02/19 0212 09/02/19 0524 09/02/19 0744  GLUCAP 127* 89 76 72 84    Microbiology: Results for orders placed or performed during the hospital encounter of 09/01/19  SARS CORONAVIRUS 2 (TAT 6-24 HRS) Nasopharyngeal Nasopharyngeal Swab     Status: None   Collection Time: 09/01/19 12:28 PM   Specimen: Nasopharyngeal Swab  Result Value Ref Range Status   SARS Coronavirus 2 NEGATIVE NEGATIVE Final    Comment: (NOTE) SARS-CoV-2 target nucleic acids are NOT DETECTED. The SARS-CoV-2 RNA is generally detectable in upper and lower respiratory specimens during  the acute phase of infection. Negative results do not preclude SARS-CoV-2 infection, do not rule out co-infections with other pathogens, and should not be used as the sole basis for treatment or other patient management decisions. Negative results must be combined with clinical observations, patient history, and epidemiological information. The expected result is Negative. Fact Sheet for Patients: SugarRoll.be Fact Sheet for Healthcare Providers: https://www.woods-mathews.com/ This test is not yet approved or cleared by the [place] FDA and  has been authorized for detection and/or diagnosis of SARS-CoV-2 by FDA under an Emergency Use Authorization (EUA). This EUA will remain  in effect (meaning this test can be used) for the duration of the COVID-19 declaration under Section 56 4(b)(1) of the Act, 21 U.S.C. section 360bbb-3(b)(1), unless the authorization is terminated or revoked sooner. Performed at [facility], [place] [address] [place], [place] [address]     Coagulation Studies: Recent Labs    09/01/19 1038  LABPROT 11.7  INR 0.9    Urinalysis:  Recent Labs  Lab 09/01/19 2211  COLORURINE YELLOW*  LABSPEC 1.029  PHURINE 5.0  GLUCOSEU 50*  HGBUR NEGATIVE  BILIRUBINUR NEGATIVE  KETONESUR NEGATIVE  PROTEINUR NEGATIVE  NITRITE NEGATIVE  LEUKOCYTESUR NEGATIVE    Lipid Panel:    Component  Value Date/Time   CHOL 161 09/02/2019 0613   TRIG 137 09/02/2019 0613   HDL 49 09/02/2019 0613   CHOLHDL 3.3 09/02/2019 0613   VLDL 27 09/02/2019 0613   LDLCALC 85 09/02/2019 0613    HgbA1C:  Lab Results  Component Value Date   HGBA1C 6.6 (H) 09/02/2019    Urine Drug Screen:      Component Value Date/Time   LABOPIA NONE DETECTED 09/01/2019 2211   COCAINSCRNUR NONE DETECTED 09/01/2019 2211   LABBENZ NONE DETECTED 09/01/2019 2211   AMPHETMU NONE DETECTED 09/01/2019 2211   THCU NONE DETECTED 09/01/2019 2211   LABBARB  NONE DETECTED 09/01/2019 2211    Alcohol Level:  Recent Labs  Lab 09/01/19 1105  ETH <10    Other results: EKG: sinus rhythm at 60 bpm.  Imaging: CT HEAD WO CONTRAST  Result Date: 09/01/2019 CLINICAL DATA:  Left-sided weakness EXAM: CT HEAD WITHOUT CONTRAST TECHNIQUE: Contiguous axial images were obtained from the base of the skull through the vertex without intravenous contrast. COMPARISON:  None. FINDINGS: Brain: There is mild diffuse atrophy. There is no intracranial mass, hemorrhage, extra-axial fluid collection, or midline shift. There is mild small vessel disease in the centra semiovale bilaterally. There is an area of decreased attenuation at the gray-white junction of the mid right frontal lobe, a finding concerning for recent and potentially acute infarct. There is decreased attenuation in the head of the caudate nucleus on the right and immediately adjacent anterior limb of the right external capsule. This area may represent an older infarct. No other findings suggesting potential acute infarct present. Vascular: No hyperdense vessel. There is calcification in each carotid siphon region. Skull: The bony calvarium appears intact. Sinuses/Orbits: There is mucosal thickening involving multiple ethmoid air cells. Other visualized paranasal sinuses are clear. There is a concha bullosa on the right, an anatomic variant. There is leftward deviation of the nasal septum. Visualized orbits appear symmetric bilaterally. Other: Visualized mastoid air cells are clear. IMPRESSION: 1. Atrophy with periventricular small vessel disease. Age uncertain but potentially recent/acute infarct in the mid right frontal lobe at the gray-white junction. Older appearing infarct involving a portion of the head of the caudate nucleus on the right in the immediately adjacent anterior limb of the right external capsule. No mass or hemorrhage. 2.  There are foci of arterial vascular calcification. 3. Mucosal thickening in  several ethmoid air cells. Deviated nasal septum. Electronically Signed   By: Lowella Grip III M.D.   On: 09/01/2019 11:37   MR ANGIO HEAD WO CONTRAST  Result Date: 09/01/2019 CLINICAL DATA:  Initial evaluation for focal neural deficit, possible stroke. EXAM: MRI HEAD WITHOUT CONTRAST MRA HEAD WITHOUT CONTRAST TECHNIQUE: Multiplanar, multiecho pulse sequences of the brain and surrounding structures were obtained without intravenous contrast. Angiographic images of the head were obtained using MRA technique without contrast. COMPARISON:  Prior CT from earlier the same day. FINDINGS: MRI HEAD FINDINGS Brain: Diffuse prominence of the CSF containing spaces compatible with generalized age-related cerebral atrophy. Patchy T2/FLAIR hyperintensity within the periventricular deep white matter both cerebral hemispheres most consistent with chronic small vessel ischemic disease, mild to moderate nature. Few scattered superimposed remote lacunar infarcts seen involving the bilateral thalami, basal ganglia, and hemispheric cerebral white matter. Small remote cortical/subcortical infarct noted involving the anterior right frontal lobe (series 20, image 32). Punctate 3 mm focus of restricted diffusion seen involving the cortex of the left parietal lobe, consistent with a small acute ischemic infarct (series 5, image 36).  Additional faint diffusion abnormality seen involving the cortex of the left occipital lobe (series 5, image 22), acute to subacute in appearance. No associated hemorrhage or mass effect. No other abnormal foci of restricted diffusion to suggest acute or subacute ischemia. Gray-white matter differentiation otherwise maintained. No acute intracranial hemorrhage. Single subcentimeter focus of susceptibility artifact noted within left parietal lobe, consistent with a small microhemorrhage, likely small vessel related. No mass lesion, midline shift or mass effect. No hydrocephalus. No extra-axial fluid  collection. Pituitary gland suprasellar region within normal limits. Midline structures intact and normal. Vascular: Major intracranial vascular flow voids are maintained. Skull and upper cervical spine: Craniocervical junction within normal limits. Bone marrow signal intensity normal. No scalp soft tissue abnormality. Sinuses/Orbits: Globes and orbital soft tissues within normal limits. Paranasal sinuses are largely clear. Right-to-left nasal septal deviation with associated concha bullosa noted. 8 mm well-circumscribed T2 hyperintense nodule at the left nasal bridge, indeterminate (series 15, image 7). Other: No mastoid effusion.  Inner ear structures grossly normal. MRA HEAD FINDINGS ANTERIOR CIRCULATION: Visualized distal cervical segments of the internal carotid arteries are widely patent with symmetric antegrade flow. Petrous segments widely patent. Multifocal atheromatous irregularity throughout the cavernous/supraclinoid ICAs without high-grade flow-limiting stenosis, slightly worse on the right. ICA termini well perfused. Right A1 widely patent. Short-segment severe stenosis noted at the proximal left M1 segment (series 9, image 106). Normal anterior communicating artery. Anterior cerebral arteries widely patent to their distal aspects without high-grade stenosis. No M1 stenosis or occlusion. Normal MCA bifurcations. Distal MCA branches well perfused and symmetric, although demonstrate diffuse small vessel atheromatous irregularity. POSTERIOR CIRCULATION: Dominant left vertebral artery widely patent to the vertebrobasilar junction. Patent left PICA. Right vertebral artery diffusely hypoplastic and not well seen, although there is flow related signal within the distal right V4 segment with perfusion of the right PICA. Basilar widely patent to its distal aspect without stenosis. Superior cerebral arteries patent bilaterally. Left PCA is supplied primarily via the basilar. Right PCA supplied via the basilar as  well as a robust right posterior communicating artery. Multifocal atheromatous irregularity throughout both PCAs, which are both patent to their distal aspects. Short-segment severe distal right P3 stenosis noted (series 1059, image 6). No intracranial aneurysm or other vascular abnormality. IMPRESSION: MRI HEAD IMPRESSION: 1. Few tiny subcentimeter acute to early subacute cortical infarcts involving the left parietal and occipital lobes as above. No associated hemorrhage. 2. Underlying age-related cerebral atrophy with moderate chronic microvascular ischemic disease. 3. 8 mm nodule involving the skin of the left nasal bridge, indeterminate. Correlation with physical exam recommended. MRA HEAD IMPRESSION: 1. No large vessel occlusion. 2. 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 No other proximal high-grade or correctable stenosis. Electronically Signed   By: Jeannine Boga M.D.   On: 09/01/2019 22:01   MR BRAIN WO CONTRAST  Result Date: 09/01/2019 CLINICAL DATA:  Initial evaluation for focal neural deficit, possible stroke. EXAM: MRI HEAD WITHOUT CONTRAST MRA HEAD WITHOUT CONTRAST TECHNIQUE: Multiplanar, multiecho pulse sequences of the brain and surrounding structures were obtained without intravenous contrast. Angiographic images of the head were obtained using MRA technique without contrast. COMPARISON:  Prior CT from earlier the same day. FINDINGS: MRI HEAD FINDINGS Brain: Diffuse prominence of the CSF containing spaces compatible with generalized age-related cerebral atrophy. Patchy T2/FLAIR hyperintensity within the periventricular deep white matter both cerebral hemispheres most consistent with chronic small vessel ischemic disease, mild to moderate nature. Few scattered superimposed remote lacunar  infarcts seen involving the bilateral thalami, basal ganglia, and hemispheric cerebral white matter. Small remote cortical/subcortical  infarct noted involving the anterior right frontal lobe (series 20, image 32). Punctate 3 mm focus of restricted diffusion seen involving the cortex of the left parietal lobe, consistent with a small acute ischemic infarct (series 5, image 36). Additional faint diffusion abnormality seen involving the cortex of the left occipital lobe (series 5, image 22), acute to subacute in appearance. No associated hemorrhage or mass effect. No other abnormal foci of restricted diffusion to suggest acute or subacute ischemia. Gray-white matter differentiation otherwise maintained. No acute intracranial hemorrhage. Single subcentimeter focus of susceptibility artifact noted within left parietal lobe, consistent with a small microhemorrhage, likely small vessel related. No mass lesion, midline shift or mass effect. No hydrocephalus. No extra-axial fluid collection. Pituitary gland suprasellar region within normal limits. Midline structures intact and normal. Vascular: Major intracranial vascular flow voids are maintained. Skull and upper cervical spine: Craniocervical junction within normal limits. Bone marrow signal intensity normal. No scalp soft tissue abnormality. Sinuses/Orbits: Globes and orbital soft tissues within normal limits. Paranasal sinuses are largely clear. Right-to-left nasal septal deviation with associated concha bullosa noted. 8 mm well-circumscribed T2 hyperintense nodule at the left nasal bridge, indeterminate (series 15, image 7). Other: No mastoid effusion.  Inner ear structures grossly normal. MRA HEAD FINDINGS ANTERIOR CIRCULATION: Visualized distal cervical segments of the internal carotid arteries are widely patent with symmetric antegrade flow. Petrous segments widely patent. Multifocal atheromatous irregularity throughout the cavernous/supraclinoid ICAs without high-grade flow-limiting stenosis, slightly worse on the right. ICA termini well perfused. Right A1 widely patent. Short-segment severe  stenosis noted at the proximal left M1 segment (series 9, image 106). Normal anterior communicating artery. Anterior cerebral arteries widely patent to their distal aspects without high-grade stenosis. No M1 stenosis or occlusion. Normal MCA bifurcations. Distal MCA branches well perfused and symmetric, although demonstrate diffuse small vessel atheromatous irregularity. POSTERIOR CIRCULATION: Dominant left vertebral artery widely patent to the vertebrobasilar junction. Patent left PICA. Right vertebral artery diffusely hypoplastic and not well seen, although there is flow related signal within the distal right V4 segment with perfusion of the right PICA. Basilar widely patent to its distal aspect without stenosis. Superior cerebral arteries patent bilaterally. Left PCA is supplied primarily via the basilar. Right PCA supplied via the basilar as well as a robust right posterior communicating artery. Multifocal atheromatous irregularity throughout both PCAs, which are both patent to their distal aspects. Short-segment severe distal right P3 stenosis noted (series 1059, image 6). No intracranial aneurysm or other vascular abnormality. IMPRESSION: MRI HEAD IMPRESSION: 1. Few tiny subcentimeter acute to early subacute cortical infarcts involving the left parietal and occipital lobes as above. No associated hemorrhage. 2. Underlying age-related cerebral atrophy with moderate chronic microvascular ischemic disease. 3. 8 mm nodule involving the skin of the left nasal bridge, indeterminate. Correlation with physical exam recommended. MRA HEAD IMPRESSION: 1. No large vessel occlusion. 2. 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 No other proximal high-grade or correctable stenosis. Electronically Signed   By: Jeannine Boga M.D.   On: 09/01/2019 22:01   DG Chest Portable 1 View  Result Date: 09/01/2019 CLINICAL DATA:  Weakness with productive cough.  EXAM: PORTABLE CHEST 1 VIEW COMPARISON:  None. FINDINGS: Lungs are hyperexpanded without consolidation or effusion. Cardiomediastinal silhouette, bones and soft tissues are normal. IMPRESSION: Hyperexpansion without acute cardiopulmonary disease. Electronically Signed   By: Marin Olp M.D.  On: 09/01/2019 11:37   CT Maxillofacial Wo Contrast  Result Date: 09/01/2019 CLINICAL DATA:  Facial swelling EXAM: CT MAXILLOFACIAL WITHOUT CONTRAST TECHNIQUE: Multidetector CT imaging of the maxillofacial structures was performed. Multiplanar CT image reconstructions were also generated. COMPARISON:  None. FINDINGS: Osseous: No acute fracture. Bony nasal septum is deviated to the left without fracture. Orbital walls are intact. Temporomandibular joints are aligned without dislocation. 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 Right-sided lesion measures up to 2.5 cm with encroachment on the floor of mouth and superiorly deviates the anterior tongue. Remaining mandibular dentition with small periapical lucencies. No cortical destruction or periostitis. Orbits: Negative. No traumatic or inflammatory finding. Sinuses: Clear. Soft tissues: No focal soft tissue swelling. No fluid collection. Limited intracranial: See concurrent CT brain report. IMPRESSION: 1. No acute facial bone fracture. 2. Remaining mandibular dentition with small periapical lucencies. No cortical destruction or periostitis. No focal soft tissue swelling or fluid collection. 3. 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 Right-sided lesion measures up to 2.5 cm with encroachment on the floor of mouth and superior deviation of the tongue. Electronically Signed   By: Davina Poke M.D.   On: 09/01/2019 11:50    Assessment: 67 y.o. male presenting with  complaints of weakness.  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  Concern is for embolic etiology.  Patient on no medications prior to admission.  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  Echocardiogram and carotid dopplers are pending.  A1c 6.6, LDL 85.    Stroke Risk Factors - none  Plan: 1. Statin for lipid management with target LDL<70. 2. BP control with target BP<140/80 3. PT consult, OT consult, Speech consult 4. Echocardiogram with bubble study pending 5. Carotid dopplers pending 6. Prophylactic therapy-Dual antiplatelet therapy with ASA 81mg  and Plavix 75mg  for three weeks with change to ASA 81mg  daily alone as monotherapy after that time. 7. NPO until RN stroke swallow screen 8. Telemetry monitoring 9. Frequent neuro checks   Alexis Goodell, MD Neurology [phone] 09/02/2019, 8:39 AM

## 2019-09-02 NOTE — ED Notes (Signed)
Pt given chocolate milk. Resting comfortably

## 2019-09-02 NOTE — ED Notes (Signed)
CBG 79. Pt given and drinking apple juice.  Per Dr. Posey Pronto give D50 for CBG below 70.

## 2019-09-03 ENCOUNTER — Encounter: Payer: Self-pay | Admitting: Internal Medicine

## 2019-09-03 ENCOUNTER — Inpatient Hospital Stay: Payer: Medicare Other

## 2019-09-03 DIAGNOSIS — E43 Unspecified severe protein-calorie malnutrition: Secondary | ICD-10-CM | POA: Insufficient documentation

## 2019-09-03 LAB — GLUCOSE, CAPILLARY
Glucose-Capillary: 84 mg/dL (ref 70–99)
Glucose-Capillary: 91 mg/dL (ref 70–99)
Glucose-Capillary: 96 mg/dL (ref 70–99)
Glucose-Capillary: 103 mg/dL — ABNORMAL HIGH (ref 70–99)

## 2019-09-03 MED ORDER — ENOXAPARIN SODIUM 30 MG/0.3ML ~~LOC~~ SOLN
30.0000 mg | SUBCUTANEOUS | Status: DC
  Administered 2019-09-03 – 2019-09-04 (×2): 30 mg via SUBCUTANEOUS
  Filled 2019-09-03 (×2): qty 0.3

## 2019-09-03 MED ORDER — IOHEXOL 350 MG/ML SOLN
75.0000 mL | Freq: Once | INTRAVENOUS | Status: AC | PRN
  Administered 2019-09-03: 75 mL via INTRAVENOUS

## 2019-09-03 MED ORDER — BUTALBITAL-APAP-CAFFEINE 50-325-40 MG PO TABS
1.0000 | ORAL_TABLET | Freq: Once | ORAL | Status: AC
  Administered 2019-09-03: 23:00:00 1 via ORAL
  Filled 2019-09-03: qty 1

## 2019-09-03 MED ORDER — ENOXAPARIN SODIUM 40 MG/0.4ML ~~LOC~~ SOLN: 40.0000 mg | SUBCUTANEOUS | Status: DC

## 2019-09-03 NOTE — Consult Note (Signed)
Subjective: No new neurological complaints  Objective: Current vital signs: BP 126/72 (BP Location: Left Arm)   Pulse 86   Temp 98.4 F (36.9 C) (Oral)   Resp 18   Ht 6\' 2"  (1.88 m)   Wt 40.8 kg   SpO2 98%   BMI 11.56 kg/m  Vital signs in last 24 hours: Temp:  [97.4 F (36.3 C)-98.6 F (37 C)] 98.4 F (36.9 C) (12/20 0528) Pulse Rate:  [64-86] 86 (12/20 0848) Resp:  [16-20] 18 (12/20 0848) BP: (115-147)/(50-72) 126/72 (12/20 0848) SpO2:  [90 %-100 %] 98 % (12/20 0848)  Intake/Output from previous day: 12/19 0701 - 12/20 0700 In: -  Out: 375 [Urine:375] Intake/Output this shift: No intake/output data recorded. Nutritional status:  Diet Order            Diet Heart Room service appropriate? Yes; Fluid consistency: Thin  Diet effective now              Neurologic Exam: Mental Status: Alert.  Oriented.  Speech fluent without evidence of aphasia.  Able to follow 3 step commands without difficulty. Cranial Nerves: II: Visual fields grossly normal, pupils equal, round, reactive to light and accommodation III,IV, VI: ptosis not present, extra-ocular motions intact bilaterally V,VII: smile symmetric, facial light touch sensation normal bilaterally VIII: hearing normal bilaterally IX,X: gag reflex present XI: bilateral shoulder shrug XII: midline tongue extension Motor: Right :  Upper extremity   5-/5                                      Left:    Upper extremity   5/5             Lower extremity   4/5                                                  Lower extremity   5/5 Tone and bulk:normal tone throughout; no atrophy noted   Lab Results: Basic Metabolic Panel: Recent Labs  Lab 09/01/19 1038  NA 140  K 4.1  CL 107  CO2 20*  GLUCOSE 107*  BUN 23  CREATININE 0.91  CALCIUM 10.0    Liver Function Tests: Recent Labs  Lab 09/01/19 1038  AST 23  ALT 9  ALKPHOS 48  BILITOT 0.6  PROT 7.5  ALBUMIN 3.8   No results for input(s): LIPASE, AMYLASE in the  last 168 hours. No results for input(s): AMMONIA in the last 168 hours.  CBC: Recent Labs  Lab 09/01/19 1038  WBC 7.5  NEUTROABS 5.5  HGB 12.4*  HCT 36.1*  MCV 102.6*  PLT 305    Cardiac Enzymes: No results for input(s): CKTOTAL, CKMB, CKMBINDEX, TROPONINI in the last 168 hours.  Lipid Panel: Recent Labs  Lab 09/02/19 0613  CHOL 161  TRIG 137  HDL 49  CHOLHDL 3.3  VLDL 27  LDLCALC 85    CBG: Recent Labs  Lab 09/02/19 1513 09/02/19 1651 09/02/19 1730 09/02/19 2119 09/03/19 0759  GLUCAP 111* 20* 88 90 84    Microbiology: Results for orders placed or performed during the hospital encounter of 09/01/19  SARS CORONAVIRUS 2 (TAT 6-24 HRS) Nasopharyngeal Nasopharyngeal Swab     Status: None   Collection Time: 09/01/19 12:28 PM  Specimen: Nasopharyngeal Swab  Result Value Ref Range Status   SARS Coronavirus 2 NEGATIVE NEGATIVE Final    Comment: (NOTE) SARS-CoV-2 target nucleic acids are NOT DETECTED. The SARS-CoV-2 RNA is generally detectable in upper and lower respiratory specimens during the acute phase of infection. Negative results do not preclude SARS-CoV-2 infection, do not rule out co-infections with other pathogens, and should not be used as the sole basis for treatment or other patient management decisions. Negative results must be combined with clinical observations, patient history, and epidemiological information. The expected result is Negative. Fact Sheet for Patients: SugarRoll.be Fact Sheet for Healthcare Providers: https://www.woods-mathews.com/ This test is not yet approved or cleared by the [place] FDA and  has been authorized for detection and/or diagnosis of SARS-CoV-2 by FDA under an Emergency Use Authorization (EUA). This EUA will remain  in effect (meaning this test can be used) for the duration of the COVID-19 declaration under Section 56 4(b)(1) of the Act, 21 U.S.C. section  360bbb-3(b)(1), unless the authorization is terminated or revoked sooner. Performed at [facility], [place] [address], [place], [place] [address]     Coagulation Studies: Recent Labs    09/01/19 1038  LABPROT 11.7  INR 0.9    Imaging: CT HEAD WO CONTRAST  Result Date: 09/01/2019 CLINICAL DATA:  Left-sided weakness EXAM: CT HEAD WITHOUT CONTRAST TECHNIQUE: Contiguous axial images were obtained from the base of the skull through the vertex without intravenous contrast. COMPARISON:  None. FINDINGS: Brain: There is mild diffuse atrophy. There is no intracranial mass, hemorrhage, extra-axial fluid collection, or midline shift. There is mild small vessel disease in the centra semiovale bilaterally. There is an area of decreased attenuation at the gray-white junction of the mid right frontal lobe, a finding concerning for recent and potentially acute infarct. There is decreased attenuation in the head of the caudate nucleus on the right and immediately adjacent anterior limb of the right external capsule. This area may represent an older infarct. No other findings suggesting potential acute infarct present. Vascular: No hyperdense vessel. There is calcification in each carotid siphon region. Skull: The bony calvarium appears intact. Sinuses/Orbits: There is mucosal thickening involving multiple ethmoid air cells. Other visualized paranasal sinuses are clear. There is a concha bullosa on the right, an anatomic variant. There is leftward deviation of the nasal septum. Visualized orbits appear symmetric bilaterally. Other: Visualized mastoid air cells are clear. IMPRESSION: 1. Atrophy with periventricular small vessel disease. Age uncertain but potentially recent/acute infarct in the mid right frontal lobe at the gray-white junction. Older appearing infarct involving a portion of the head of the caudate nucleus on the right in the immediately adjacent anterior limb of the right external capsule. No mass  or hemorrhage. 2.  There are foci of arterial vascular calcification. 3. Mucosal thickening in several ethmoid air cells. Deviated nasal septum. Electronically Signed   By: Lowella Grip III M.D.   On: 09/01/2019 11:37   MR ANGIO HEAD WO CONTRAST  Result Date: 09/01/2019 CLINICAL DATA:  Initial evaluation for focal neural deficit, possible stroke. EXAM: MRI HEAD WITHOUT CONTRAST MRA HEAD WITHOUT CONTRAST TECHNIQUE: Multiplanar, multiecho pulse sequences of the brain and surrounding structures were obtained without intravenous contrast. Angiographic images of the head were obtained using MRA technique without contrast. COMPARISON:  Prior CT from earlier the same day. FINDINGS: MRI HEAD FINDINGS Brain: Diffuse prominence of the CSF containing spaces compatible with generalized age-related cerebral atrophy. Patchy T2/FLAIR hyperintensity within the periventricular deep white matter  both cerebral hemispheres most consistent with chronic small vessel ischemic disease, mild to moderate nature. Few scattered superimposed remote lacunar infarcts seen involving the bilateral thalami, basal ganglia, and hemispheric cerebral white matter. Small remote cortical/subcortical infarct noted involving the anterior right frontal lobe (series 20, image 32). Punctate 3 mm focus of restricted diffusion seen involving the cortex of the left parietal lobe, consistent with a small acute ischemic infarct (series 5, image 36). Additional faint diffusion abnormality seen involving the cortex of the left occipital lobe (series 5, image 22), acute to subacute in appearance. No associated hemorrhage or mass effect. No other abnormal foci of restricted diffusion to suggest acute or subacute ischemia. Gray-white matter differentiation otherwise maintained. No acute intracranial hemorrhage. Single subcentimeter focus of susceptibility artifact noted within left parietal lobe, consistent with a small microhemorrhage, likely small vessel  related. No mass lesion, midline shift or mass effect. No hydrocephalus. No extra-axial fluid collection. Pituitary gland suprasellar region within normal limits. Midline structures intact and normal. Vascular: Major intracranial vascular flow voids are maintained. Skull and upper cervical spine: Craniocervical junction within normal limits. Bone marrow signal intensity normal. No scalp soft tissue abnormality. Sinuses/Orbits: Globes and orbital soft tissues within normal limits. Paranasal sinuses are largely clear. Right-to-left nasal septal deviation with associated concha bullosa noted. 8 mm well-circumscribed T2 hyperintense nodule at the left nasal bridge, indeterminate (series 15, image 7). Other: No mastoid effusion.  Inner ear structures grossly normal. MRA HEAD FINDINGS ANTERIOR CIRCULATION: Visualized distal cervical segments of the internal carotid arteries are widely patent with symmetric antegrade flow. Petrous segments widely patent. Multifocal atheromatous irregularity throughout the cavernous/supraclinoid ICAs without high-grade flow-limiting stenosis, slightly worse on the right. ICA termini well perfused. Right A1 widely patent. Short-segment severe stenosis noted at the proximal left M1 segment (series 9, image 106). Normal anterior communicating artery. Anterior cerebral arteries widely patent to their distal aspects without high-grade stenosis. No M1 stenosis or occlusion. Normal MCA bifurcations. Distal MCA branches well perfused and symmetric, although demonstrate diffuse small vessel atheromatous irregularity. POSTERIOR CIRCULATION: Dominant left vertebral artery widely patent to the vertebrobasilar junction. Patent left PICA. Right vertebral artery diffusely hypoplastic and not well seen, although there is flow related signal within the distal right V4 segment with perfusion of the right PICA. Basilar widely patent to its distal aspect without stenosis. Superior cerebral arteries patent  bilaterally. Left PCA is supplied primarily via the basilar. Right PCA supplied via the basilar as well as a robust right posterior communicating artery. Multifocal atheromatous irregularity throughout both PCAs, which are both patent to their distal aspects. Short-segment severe distal right P3 stenosis noted (series 1059, image 6). No intracranial aneurysm or other vascular abnormality. IMPRESSION: MRI HEAD IMPRESSION: 1. Few tiny subcentimeter acute to early subacute cortical infarcts involving the left parietal and occipital lobes as above. No associated hemorrhage. 2. Underlying age-related cerebral atrophy with moderate chronic microvascular ischemic disease. 3. 8 mm nodule involving the skin of the left nasal bridge, indeterminate. Correlation with physical exam recommended. MRA HEAD IMPRESSION: 1. No large vessel occlusion. 2. 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 No other proximal high-grade or correctable stenosis. Electronically Signed   By: Jeannine Boga M.D.   On: 09/01/2019 22:01   MR BRAIN WO CONTRAST  Result Date: 09/01/2019 CLINICAL DATA:  Initial evaluation for focal neural deficit, possible stroke. EXAM: MRI HEAD WITHOUT CONTRAST MRA HEAD WITHOUT CONTRAST TECHNIQUE: Multiplanar, multiecho pulse sequences of the brain and  surrounding structures were obtained without intravenous contrast. Angiographic images of the head were obtained using MRA technique without contrast. COMPARISON:  Prior CT from earlier the same day. FINDINGS: MRI HEAD FINDINGS Brain: Diffuse prominence of the CSF containing spaces compatible with generalized age-related cerebral atrophy. Patchy T2/FLAIR hyperintensity within the periventricular deep white matter both cerebral hemispheres most consistent with chronic small vessel ischemic disease, mild to moderate nature. Few scattered superimposed remote lacunar infarcts seen involving the bilateral  thalami, basal ganglia, and hemispheric cerebral white matter. Small remote cortical/subcortical infarct noted involving the anterior right frontal lobe (series 20, image 32). Punctate 3 mm focus of restricted diffusion seen involving the cortex of the left parietal lobe, consistent with a small acute ischemic infarct (series 5, image 36). Additional faint diffusion abnormality seen involving the cortex of the left occipital lobe (series 5, image 22), acute to subacute in appearance. No associated hemorrhage or mass effect. No other abnormal foci of restricted diffusion to suggest acute or subacute ischemia. Gray-white matter differentiation otherwise maintained. No acute intracranial hemorrhage. Single subcentimeter focus of susceptibility artifact noted within left parietal lobe, consistent with a small microhemorrhage, likely small vessel related. No mass lesion, midline shift or mass effect. No hydrocephalus. No extra-axial fluid collection. Pituitary gland suprasellar region within normal limits. Midline structures intact and normal. Vascular: Major intracranial vascular flow voids are maintained. Skull and upper cervical spine: Craniocervical junction within normal limits. Bone marrow signal intensity normal. No scalp soft tissue abnormality. Sinuses/Orbits: Globes and orbital soft tissues within normal limits. Paranasal sinuses are largely clear. Right-to-left nasal septal deviation with associated concha bullosa noted. 8 mm well-circumscribed T2 hyperintense nodule at the left nasal bridge, indeterminate (series 15, image 7). Other: No mastoid effusion.  Inner ear structures grossly normal. MRA HEAD FINDINGS ANTERIOR CIRCULATION: Visualized distal cervical segments of the internal carotid arteries are widely patent with symmetric antegrade flow. Petrous segments widely patent. Multifocal atheromatous irregularity throughout the cavernous/supraclinoid ICAs without high-grade flow-limiting stenosis, slightly  worse on the right. ICA termini well perfused. Right A1 widely patent. Short-segment severe stenosis noted at the proximal left M1 segment (series 9, image 106). Normal anterior communicating artery. Anterior cerebral arteries widely patent to their distal aspects without high-grade stenosis. No M1 stenosis or occlusion. Normal MCA bifurcations. Distal MCA branches well perfused and symmetric, although demonstrate diffuse small vessel atheromatous irregularity. POSTERIOR CIRCULATION: Dominant left vertebral artery widely patent to the vertebrobasilar junction. Patent left PICA. Right vertebral artery diffusely hypoplastic and not well seen, although there is flow related signal within the distal right V4 segment with perfusion of the right PICA. Basilar widely patent to its distal aspect without stenosis. Superior cerebral arteries patent bilaterally. Left PCA is supplied primarily via the basilar. Right PCA supplied via the basilar as well as a robust right posterior communicating artery. Multifocal atheromatous irregularity throughout both PCAs, which are both patent to their distal aspects. Short-segment severe distal right P3 stenosis noted (series 1059, image 6). No intracranial aneurysm or other vascular abnormality. IMPRESSION: MRI HEAD IMPRESSION: 1. Few tiny subcentimeter acute to early subacute cortical infarcts involving the left parietal and occipital lobes as above. No associated hemorrhage. 2. Underlying age-related cerebral atrophy with moderate chronic microvascular ischemic disease. 3. 8 mm nodule involving the skin of the left nasal bridge, indeterminate. Correlation with physical exam recommended. MRA HEAD IMPRESSION: 1. No large vessel occlusion. 2. 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 No other proximal high-grade or  correctable stenosis. Electronically Signed   By: Jeannine Boga M.D.   On: 09/01/2019 22:01   US Carotid  Bilateral (at [facility] and [facility] only)  Result Date: 09/02/2019 CLINICAL DATA:  Left-sided weakness and cerebral infarction. EXAM: BILATERAL CAROTID DUPLEX ULTRASOUND TECHNIQUE: Pearline Cables scale imaging, color Doppler and duplex ultrasound were performed of bilateral carotid and vertebral arteries in the neck. COMPARISON:  None. FINDINGS: Criteria: Quantification of carotid stenosis is based on velocity parameters that correlate the residual internal carotid diameter with NASCET-based stenosis levels, using the diameter of the distal internal carotid lumen as the denominator for stenosis measurement. The following velocity measurements were obtained: RIGHT ICA:  154/51 distal, 69/18 proximal cm/sec CCA:  0000000 cm/sec SYSTOLIC ICA/CCA RATIO:  1.8 ECA:  87 cm/sec LEFT ICA:  390/133 cm/sec CCA:  [id number] cm/sec SYSTOLIC ICA/CCA RATIO:  7.3 ECA:  99 cm/sec RIGHT CAROTID ARTERY: Moderate calcified plaque in the distal common carotid artery, carotid bulb and internal carotid artery. Estimated proximal right ICA stenosis is less than 50%. RIGHT VERTEBRAL ARTERY: Antegrade flow with normal waveform and velocity. LEFT CAROTID ARTERY: Severe amount of predominately calcified plaque is present at the level of the distal bulb and proximal left ICA. Turbulent flow and elevated velocities correspond to an estimated greater than 70% left ICA stenosis. LEFT VERTEBRAL ARTERY: Antegrade flow with normal waveform and velocity. IMPRESSION: 1. Significant estimated greater than 70% proximal left ICA stenosis. 2. Estimated less than 50% proximal right ICA stenosis. Electronically Signed   By: Aletta Edouard M.D.   On: 09/02/2019 13:40   DG Chest Portable 1 View  Result Date: 09/01/2019 CLINICAL DATA:  Weakness with productive cough. EXAM: PORTABLE CHEST 1 VIEW COMPARISON:  None. FINDINGS: Lungs are hyperexpanded without consolidation or effusion. Cardiomediastinal silhouette, bones and soft tissues are normal. IMPRESSION: Hyperexpansion without  acute cardiopulmonary disease. Electronically Signed   By: Marin Olp M.D.   On: 09/01/2019 11:37   DG Abd Portable 1V  Result Date: 09/02/2019 CLINICAL DATA:  Constipation, intermittent constipation. EXAM: PORTABLE ABDOMEN - 1 VIEW COMPARISON:  None. FINDINGS: Lung bases are clear. No signs of free air on supine radiography. Scattered gas-filled loops of small bowel with mild distension to moderate distension. Gas and stool in the rectum and scattered loops of gas-filled colon. No acute bone finding. IMPRESSION: Findings most suggestive of global ileus. Early partial small bowel obstruction might have a similar appearance, consider continued follow-up for further assessment. Electronically Signed   By: Zetta Bills M.D.   On: 09/02/2019 10:34   ECHOCARDIOGRAM COMPLETE  Result Date: 09/02/2019   ECHOCARDIOGRAM REPORT   Patient Name:   Eugene Strong Date of Exam: 09/02/2019 Medical Rec #:  [id number]      Height:       74.0 in Accession #:    [id number]     Weight:       90.0 lb Date of Birth:  [birth year]      BSA:          1.55 m Patient Age:    22 years       BP:           146/72 mmHg Patient Gender: M              HR:           67 bpm. Exam Location:  [facility] Procedure: 2D Echo Indications:     Stroke 434.91/I163.9  History:         Patient has no  prior history of Echocardiogram examinations.                  Risk Factors:Hypertension.  Sonographer:     Avanell Shackleton Referring Phys:  Unknown Foley NIU Diagnosing Phys: Skeet Latch MD  Sonographer Comments: Technically difficult study due to poor echo windows. IMPRESSIONS  1. Left ventricular ejection fraction, by visual estimation, is 60 to 65%. The left ventricle has normal function. Left ventricular septal wall thickness was normal. Normal left ventricular posterior wall thickness. There is no left ventricular hypertrophy.  2. Left ventricular diastolic parameters are consistent with Grade II diastolic dysfunction (pseudonormalization).  3. The  left ventricle has no regional wall motion abnormalities.  4. Global right ventricle has normal systolic function.The right ventricular size is normal. No increase in right ventricular wall thickness.  5. Left atrial size was normal.  6. Right atrial size was normal.  7. The mitral valve is normal in structure. No evidence of mitral valve regurgitation. No evidence of mitral stenosis.  8. The tricuspid valve is normal in structure. Tricuspid valve regurgitation is mild.  9. The aortic valve is normal in structure. Aortic valve regurgitation is not visualized. No evidence of aortic valve sclerosis or stenosis. 10. The pulmonic valve was normal in structure. Pulmonic valve regurgitation is not visualized. 11. Normal pulmonary artery systolic pressure. 12. The inferior vena cava is normal in size with greater than 50% respiratory variability, suggesting right atrial pressure of 3 mmHg. FINDINGS  Left Ventricle: Left ventricular ejection fraction, by visual estimation, is 60 to 65%. The left ventricle has normal function. The left ventricle has no regional wall motion abnormalities. Normal left ventricular posterior wall thickness. There is no left ventricular hypertrophy. Left ventricular diastolic parameters are consistent with Grade II diastolic dysfunction (pseudonormalization). Indeterminate filling pressures. Right Ventricle: The right ventricular size is normal. No increase in right ventricular wall thickness. Global RV systolic function is has normal systolic function. The tricuspid regurgitant velocity is 2.43 m/s, and with an assumed right atrial pressure  of 3 mmHg, the estimated right ventricular systolic pressure is normal at 26.7 mmHg. Left Atrium: Left atrial size was normal in size. Right Atrium: Right atrial size was normal in size Pericardium: There is no evidence of pericardial effusion. Mitral Valve: The mitral valve is normal in structure. No evidence of mitral valve regurgitation. No evidence of  mitral valve stenosis by observation. Tricuspid Valve: The tricuspid valve is normal in structure. Tricuspid valve regurgitation is mild. Aortic Valve: The aortic valve is normal in structure. Aortic valve regurgitation is not visualized. The aortic valve is structurally normal, with no evidence of sclerosis or stenosis. Pulmonic Valve: The pulmonic valve was normal in structure. Pulmonic valve regurgitation is not visualized. Pulmonic regurgitation is not visualized. Aorta: The aortic root, ascending aorta and aortic arch are all structurally normal, with no evidence of dilitation or obstruction. Venous: The inferior vena cava is normal in size with greater than 50% respiratory variability, suggesting right atrial pressure of 3 mmHg. IAS/Shunts: No atrial level shunt detected by color flow Doppler. There is no evidence of a patent foramen ovale. No ventricular septal defect is seen or detected. There is no evidence of an atrial septal defect.  LEFT VENTRICLE PLAX 2D LVIDd:         2.54 cm  Diastology LVIDs:         1.25 cm  LV e' lateral:   7.62 cm/s LV PW:  0.90 cm  LV E/e' lateral: 9.6 LV IVS:        0.84 cm  LV e' medial:    6.20 cm/s LVOT diam:     2.00 cm  LV E/e' medial:  11.7 LV SV:         19 ml LV SV Index:   13.70 LVOT Area:     3.14 cm  RIGHT VENTRICLE             IVC RV S prime:     13.40 cm/s  IVC diam: 1.30 cm TAPSE (M-mode): 2.1 cm LEFT ATRIUM             Index       RIGHT ATRIUM           Index LA diam:        2.30 cm 1.49 cm/m  RA Area:     10.30 cm LA Vol (A2C):   29.0 ml 18.73 ml/m RA Volume:   21.90 ml  14.15 ml/m LA Vol (A4C):   14.8 ml 9.56 ml/m LA Biplane Vol: 22.3 ml 14.41 ml/m   AORTA Ao Root diam: 3.20 cm MITRAL VALVE                        TRICUSPID VALVE MV Area (PHT): 3.37 cm             TR Peak grad:   23.7 mmHg MV PHT:        65.25 msec           TR Vmax:        273.00 cm/s MV Decel Time: 225 msec MV E velocity: 72.80 cm/s 103 cm/s  SHUNTS MV A velocity: 58.20 cm/s 70.3  cm/s Systemic Diam: 2.00 cm MV E/A ratio:  1.25       1.5  Skeet Latch MD Electronically signed by Skeet Latch MD Signature Date/Time: 09/02/2019/2:32:21 PM    Final    CT Maxillofacial Wo Contrast  Result Date: 09/01/2019 CLINICAL DATA:  Facial swelling EXAM: CT MAXILLOFACIAL WITHOUT CONTRAST TECHNIQUE: Multidetector CT imaging of the maxillofacial structures was performed. Multiplanar CT image reconstructions were also generated. COMPARISON:  None. FINDINGS: Osseous: No acute fracture. Bony nasal septum is deviated to the left without fracture. Orbital walls are intact. Temporomandibular joints are aligned without dislocation. 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 Right-sided lesion measures up to 2.5 cm with encroachment on the floor of mouth and superiorly deviates the anterior tongue. Remaining mandibular dentition with small periapical lucencies. No cortical destruction or periostitis. Orbits: Negative. No traumatic or inflammatory finding. Sinuses: Clear. Soft tissues: No focal soft tissue swelling. No fluid collection. Limited intracranial: See concurrent CT brain report. IMPRESSION: 1. No acute facial bone fracture. 2. Remaining mandibular dentition with small periapical lucencies. No cortical destruction or periostitis. No focal soft tissue swelling or fluid collection. 3. 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 Right-sided lesion measures up to 2.5 cm with encroachment on the floor of mouth and superior deviation of the tongue. Electronically Signed   By: Davina Poke M.D.   On: 09/01/2019 11:50    Medications:  I have reviewed the patient's current medications. Scheduled: . aspirin EC  81 mg Oral Daily  . atorvastatin  80 mg Oral q1800  . bisacodyl  10 mg Rectal Once  .  clopidogrel  75 mg Oral Daily  . enoxaparin (LOVENOX) injection  30 mg Subcutaneous Q24H  . feeding supplement (ENSURE ENLIVE)  237 mL Oral TID WC  . insulin aspart  0-6 Units Subcutaneous TID WC  . magic mouthwash  15 mL Oral QID  . mouth rinse  15 mL Mouth Rinse BID  . polyethylene glycol  17 g Oral Daily    Assessment/Plan: 68 y.o. male presenting with complaints of weakness.  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  Concern is for embolic etiology.  Patient on no medications prior to admission.  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  Carotid dopplers show no evidence of hemodynamically significant stenosis on the right with >70% stenosis on the left.  Echocardiogram shows no cardiac source of emboli with an EF of 60-65%.  A1c 6.6, LDL 85.  Patient on ASA, statin and Plavix.  Recommendations: 1. PT consult, OT consult, Speech consult 2. Continue ASA, Plavix and statin 3. CTA of the neck.  4. Vascular consult 5. Telemetry monitoring 6. Frequent neuro checks    LOS: 2 days   Alexis Goodell, MD Neurology [phone] 09/03/2019  10:13 AM

## 2019-09-03 NOTE — Progress Notes (Signed)
Initial Nutrition Assessment  DOCUMENTATION CODES:   Severe malnutrition in context of social or environmental circumstances, Underweight  INTERVENTION:  Will downgrade diet to dysphagia 3 with thin liquids in setting of difficulty chewing.  Continue Ensure Enlive po TID, each supplement provides 350 kcal and 20 grams of protein.  Monitor magnesium, potassium, and phosphorus daily for at least 3 days, MD to replete as needed, as pt is at risk for refeeding syndrome given severe malnutrition.  NUTRITION DIAGNOSIS:   Severe Malnutrition related to social / environmental circumstances(inadequate oral intake, mouth pain) as evidenced by severe fat depletion, severe muscle depletion.  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   Met with patient at bedside. He reports he has a good appetite and intake here. He ate 100% of his breakfast this morning and a good portion of his lunch. He reports that PTA he was living alone and had not been eating well for approximately one year. He was having difficulty with obtaining and cooking well-balanced meals. He also started developing mouth pain and difficulty chewing (reports he has 3 abscesses in his mouth). Patient reports he is still having some difficulty chewing current diet. He is amenable to a dysphagia 3 (mechanical soft diet). He is drinking Ensure and enjoys them. We discussed option of Magic Cup but he is having difficulty with cold foods at this time. He reports he will be going to stay with his sister after discharge and she will be cooking meals for him.  Patient  reports his UBW was 165 lbs but he is unsure when he last weighed that or when he started losing weight. No weight history in chart to trend.  Medications reviewed and include: Novolog 0-6 units TID, magic mouthwash, Miralax.  Labs reviewed: CBG 20-111.  NUTRITION - FOCUSED PHYSICAL EXAM:    Most Recent Value  Orbital Region  Severe depletion  Upper Arm Region  Severe depletion  Thoracic and Lumbar Region  Severe depletion  Buccal Region  Severe depletion  Temple Region  Severe depletion  Clavicle Bone Region  Severe depletion  Clavicle and Acromion Bone Region  Severe depletion  Scapular Bone Region  Severe depletion  Dorsal Hand  Severe depletion  Patellar Region  Severe depletion  Anterior Thigh Region  Severe depletion  Posterior Calf Region  Severe depletion  Edema (RD Assessment)  None  Hair  Reviewed  Eyes  Reviewed  Mouth  Reviewed  Skin  Reviewed  Nails  Reviewed     Diet Order:   Diet Order            Diet Heart Room service appropriate? Yes; Fluid consistency: Thin  Diet effective now             EDUCATION NEEDS:   No education needs have been identified at this time  Skin:  Skin Assessment: Reviewed RN Assessment  Last BM:  09/03/2019 - medium type 4  Height:   Ht Readings from Last 1 Encounters:  09/01/19 '6\' 2"'  (1.88 m)   Weight:   Wt Readings from Last 1 Encounters:  09/01/19 40.8 kg   Ideal Body Weight:  86.4 kg  BMI:  Body mass index is 11.56 kg/m.  Estimated Nutritional Needs:   Kcal:  1400-1600  Protein:  72-82 grams  Fluid:  1.4-1.6 L/day  Jacklynn Barnacle, MS, RD, LDN Office: [phone] Pager: [phone] After Hours/Weekend Pager: [phone]

## 2019-09-03 NOTE — Progress Notes (Signed)
[facility] Progress Note  Patient: Eugene Strong [id number]   PCP: Patient, No Pcp Per DOB: December 07, 1950   DOA: 09/01/2019   DOS: 09/03/2019   Date of Service: the patient was seen and examined on 09/03/2019  Chief Complaint  Patient presents with  . Weakness   Brief hospital course: Pt. with PMH of CVA, smoker, weight loss; presented with complain of left-sided weakness, was found to have acute embolic CVA.  Currently further plan is for the stroke work-up.  Subjective: reports throat pain. No chest pain, no shortness of breath, no fever or chills.   Assessment and Plan: Scheduled Meds: . aspirin EC  81 mg Oral Daily  . atorvastatin  80 mg Oral q1800  . bisacodyl  10 mg Rectal Once  . clopidogrel  75 mg Oral Daily  . enoxaparin (LOVENOX) injection  30 mg Subcutaneous Q24H  . feeding supplement (ENSURE ENLIVE)  237 mL Oral TID WC  . insulin aspart  0-6 Units Subcutaneous TID WC  . magic mouthwash  15 mL Oral QID  . mouth rinse  15 mL Mouth Rinse BID  . polyethylene glycol  17 g Oral Daily   Continuous Infusions: PRN Meds: acetaminophen **OR** acetaminophen (TYLENOL) oral liquid 160 mg/5 mL **OR** acetaminophen, dextrose, hydrALAZINE, lidocaine, ondansetron (ZOFRAN) IV, senna-docusate  1.  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 Out of the window For any TPA. PT OT consult recommendation home health  Speech therapy consult pending. Patient passed swallow evaluation. We will advance diet. Permissive hypertension. Aspirin and Plavix for 3 weeks followed by aspirin alone. LDL 85 add Lipitor 80 mg. Monitor recommendation from therapist  2.  Hypoglycemia. Type 2 diabetes mellitus based on hemoglobin A1c New onset diabetes is the patient never has seen a doctor for last 20 years. Patient actually was hypoglycemic likely from poor p.o. intake. Hemoglobin A1c currently down to 6.6. Will benefit from being on  Metformin. Add sensitive sliding scale.  3.  Underweight Body mass index is 11.56 kg/m.  Nutrition Problem: Severe Malnutrition Etiology: social / environmental circumstances(inadequate oral intake, mouth pain)  At risk for refeeding syndrome.  Nutrition Interventions: Interventions: Refer to RD note for recommendations Likely from poor p.o. intake from mandibular tori.  4.  Throat pain.   Etiology not clear.  No erythema on the pharynx identified at the time of examination. Add Magic mouthwash on scheduled basis. Lidocaine mouthwash as needed.  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 CT maxillofacial suggest dense bony overgrowth from the anterior mandible bilaterally suggesting mandibular tori. Has encroachment on the floor of the mouth as well as deviates the anterior tongue as well. Likely causing poor p.o. intake and possible resultant malnutrition. We will need to arrange ENT follow-up outpatient.  6.  Flatulence. Ileus. Constipation Patient reported significant Flatulence and diarrhea X-ray abdomen shows evidence of scattered gas-filled bowel loops with mild to moderate distention. There is also stool in the rectum and scattered loops of the gas-filled colon. Repeat x ray abdomen tomorrow  Diet: Regular diet  DVT Prophylaxis: Subcutaneous Lovenox   Advance goals of care discussion: Full code  Family Communication: no family was present at bedside, at the time of interview.   Disposition:  Discharge to home.  Consultants: Neurology Procedures: Echocardiogram   Antibiotics: Anti-infectives (From admission, onward)   None       Objective: Physical Exam: Vitals:   09/03/19 0528 09/03/19 0848 09/03/19 1200 09/03/19 1535  BP: 136/63 126/72 123/69 113/63  Pulse: 65 86 85 72  Resp: 16 18 18 18   Temp: 98.4 F (36.9 C)   98.4 F (36.9 C)  TempSrc: Oral   Oral  SpO2: 96% 98% 100% 98%  Weight:      Height:         Intake/Output Summary (Last 24 hours) at 09/03/2019 1714 Last data filed at 09/03/2019 1300 Gross per 24 hour  Intake 480 ml  Output 175 ml  Net 305 ml   Filed Weights   09/01/19 1028  Weight: 40.8 kg   General: Cachectic appearing gentleman, alert and oriented to time, place, and person. Appear in mild distress, affect appropriate  Eyes: PERRL, Conjunctiva normal ENT: Oral Mucosa Clear, moist lower palate has hard bony lesion Neck: no JVD, no Abnormal Mass Or lumps Cardiovascular: S1 and S2 Present, no Murmur,  Respiratory: good respiratory effort, Bilateral Air entry equal and Decreased, no signs of accessory muscle use, Clear to Auscultation, no Crackles, no wheezes Abdomen: Bowel Sound present, Soft and no tenderness, no hernia Skin: no rashes  Extremities: no Pedal edema, no calf tenderness Neurologic: mental status, alert and oriented x3, speech normal, PERLA, Motor strength reduced at right upper, right lower and Positive pronator drift on the right extremity, Sensation grossly normal to light touch, Reflex difficult to assess and Finger to nose test normal bilaterally  Gait not checked due to patient safety concerns  Data Reviewed: I have personally reviewed and interpreted daily labs, tele strips, imagings as discussed above. I reviewed all nursing notes, pharmacy notes, vitals, pertinent old records I have discussed plan of care as described above with RN and patient/family.  CBC: Recent Labs  Lab 09/01/19 1038  WBC 7.5  NEUTROABS 5.5  HGB 12.4*  HCT 36.1*  MCV 102.6*  PLT 123456   Basic Metabolic Panel: Recent Labs  Lab 09/01/19 1038  NA 140  K 4.1  CL 107  CO2 20*  GLUCOSE 107*  BUN 23  CREATININE 0.91  CALCIUM 10.0    Liver Function Tests: Recent Labs  Lab 09/01/19 1038  AST 23  ALT 9  ALKPHOS 48  BILITOT 0.6  PROT 7.5  ALBUMIN 3.8   No results for input(s): LIPASE, AMYLASE in the last 168 hours. No results for input(s): AMMONIA in the  last 168 hours. Coagulation Profile: Recent Labs  Lab 09/01/19 1038  INR 0.9   Cardiac Enzymes: No results for input(s): CKTOTAL, CKMB, CKMBINDEX, TROPONINI in the last 168 hours. BNP (last 3 results) No results for input(s): PROBNP in the last 8760 hours. CBG: Recent Labs  Lab 09/02/19 1730 09/02/19 2119 09/03/19 0759 09/03/19 1139 09/03/19 1636  GLUCAP 88 90 84 91 96   Studies: CT ANGIO NECK W OR WO CONTRAST  Result Date: 09/03/2019 CLINICAL DATA:  Stroke follow-up.  Carotid stenosis by Doppler EXAM: CT ANGIOGRAPHY NECK TECHNIQUE: Multidetector CT imaging of the neck was performed using the standard protocol during bolus administration of intravenous contrast. Multiplanar CT image reconstructions and MIPs were obtained to evaluate the vascular anatomy. Carotid stenosis measurements (when applicable) are obtained utilizing NASCET criteria, using the distal internal carotid diameter as the denominator. CONTRAST:  60mL OMNIPAQUE IOHEXOL 350 MG/ML SOLN COMPARISON:  Carotid Doppler from yesterday FINDINGS: Aortic arch: Multifocal atherosclerotic plaque. Three vessel branching. Right carotid system: Scattered mainly calcified atherosclerotic plaque along the common carotid and proximal ICA. Proximal ICA stenosis measures up to 45% as measured on reformats. No beading or ulceration. Carotid  siphon atherosclerotic plaque without suspected flow reducing stenosis. The right ICA is larger than the left at least partially due to circle-of-Willis variant Left carotid system: Calcified plaque at the left common carotid origin without flow reducing stenosis. Multifocal calcified plaque along the common carotid with bulky low-density plaque along the posterior wall at the ICA bulb causing severe stenosis with short segment string sign. The downstream vessel may be under filled. Carotid siphon atherosclerotic plaque without flow reducing stenosis. Vertebral arteries: Proximal subclavian atherosclerosis  without flow reducing stenosis. High-grade atheromatous narrowing at both vertebral origins, worse on the non dominant right side which is not seen beyond the PICA until reconstitution at the distal V4 segment. Skeleton: Degenerative changes in the cervical spine. 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 Poor dentition. Other neck: No acute finding Upper chest: COPD changes. IMPRESSION: 1. Critical proximal left ICA stenosis with short segment string sign. 2. 45% proximal right ICA atheromatous stenosis. 3. High-grade narrowing at both vertebral origins with occluded V4 segment on the non dominant right side. 4. 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 Electronically Signed   By: Monte Fantasia M.D.   On: 09/03/2019 12:39   Time spent: 35 minutes  Author: Berle Mull, MD [facility] 09/03/2019 5:14 PM  To reach On-call, see care teams to locate the attending and reach out to them via [email or web address]. If 7PM-7AM, please contact night-coverage If you still have difficulty reaching the attending provider, please page the [facility] (Director on Call) for [facility] on amion for assistance.

## 2019-09-03 NOTE — Progress Notes (Signed)
Physical Therapy Treatment Patient Details Name: Eugene Strong MRN: [id number] DOB: 1950-09-29 Today's Date: 09/03/2019    History of Present Illness Pt is a 68 year old M who comes to the ED with L side weakness and slurred speech.  Imaging revealed  acute embolic left parietal occipital CVA and pt is generally weak but symptoms have mostly resolved.  [place] includes CVA affecting L side.    PT Comments    Feeling better.  Denies dizzines.  Out of bed with supervision.   He is able to stand and walk with generally unsteady gait and min guard/assist to nursing station and back. Limited by fatigue.  He is given an extended rest to recover but after over 10 minutes does not feel recovered enough to try a RW.  He does not have one at home but it would be beneficial for him to use for balance and energy conservation.  Pt agreed that it would be helpful.  Stated he plans to stay with his sister upon discharge.  Encouraged him to have her walk with him for safety initially upon discharge.  Discussed energy conservation and pacing of activities.   Follow Up Recommendations  Home health PT;Supervision for mobility/OOB     Equipment Recommendations  Rolling walker with 5" wheels    Recommendations for Other Services       Precautions / Restrictions Precautions Precautions: Fall Restrictions Weight Bearing Restrictions: No    Mobility  Bed Mobility Overal bed mobility: Modified Independent       Supine to sit: Supervision        Transfers Overall transfer level: Needs assistance Equipment used: None Transfers: Sit to/from Stand Sit to Stand: Min guard            Ambulation/Gait Ambulation/Gait assistance: Min guard;Min assist Gait Distance (Feet): 45 Feet Assistive device: None Gait Pattern/deviations: Step-to pattern;Staggering right;Staggering left;Wide base of support Gait velocity: decreased   General Gait Details: able to walk to desk and back without dizziness but  fatigues quickly.   Stairs             Wheelchair Mobility    Modified Rankin (Stroke Patients Only)       Balance Overall balance assessment: Needs assistance Sitting-balance support: Feet supported Sitting balance-Leahy Scale: Good     Standing balance support: No upper extremity supported Standing balance-Leahy Scale: Poor Standing balance comment: generally unsteady                            Cognition Arousal/Alertness: Awake/alert Behavior During Therapy: WFL for tasks assessed/performed Overall Cognitive Status: Within Functional Limits for tasks assessed                                 General Comments: A&O to self, situation and location.  Follows commands consistently.      Exercises      General Comments        Pertinent Vitals/Pain Pain Assessment: No/denies pain    Home Living                      Prior Function            PT Goals (current goals can now be found in the care plan section) Progress towards PT goals: Progressing toward goals    Frequency    7X/week  PT Plan Current plan remains appropriate    Co-evaluation              AM-PAC PT "6 Clicks" Mobility   Outcome Measure  Help needed turning from your back to your side while in a flat bed without using bedrails?: None Help needed moving from lying on your back to sitting on the side of a flat bed without using bedrails?: None Help needed moving to and from a bed to a chair (including a wheelchair)?: A Little Help needed standing up from a chair using your arms (e.g., wheelchair or bedside chair)?: A Little Help needed to walk in hospital room?: A Little Help needed climbing 3-5 steps with a railing? : A Lot 6 Click Score: 19    End of Session Equipment Utilized During Treatment: Gait belt Activity Tolerance: Patient limited by fatigue Patient left: in chair;with call bell/phone within reach;with chair alarm set;with  nursing/sitter in room Nurse Communication: Mobility status       Time: [id number] PT Time Calculation (min) (ACUTE ONLY): 19 min  Charges:  $Gait Training: 8-22 mins                    Chesley Noon, PTA 09/03/19, 9:19 AM

## 2019-09-03 NOTE — Plan of Care (Signed)
Pt does not have any deficits at this time. Will continue to monitor.

## 2019-09-04 ENCOUNTER — Inpatient Hospital Stay: Payer: Medicare Other

## 2019-09-04 LAB — BASIC METABOLIC PANEL
Anion gap: 8 (ref 5–15)
BUN: 16 mg/dL (ref 8–23)
CO2: 25 mmol/L (ref 22–32)
Calcium: 8.5 mg/dL — ABNORMAL LOW (ref 8.9–10.3)
Chloride: 106 mmol/L (ref 98–111)
Creatinine, Ser: 0.52 mg/dL — ABNORMAL LOW (ref 0.61–1.24)
GFR calc Af Amer: >60 mL/min (ref >60)
GFR calc non Af Amer: >60 mL/min (ref >60)
Glucose, Bld: 92 mg/dL (ref 70–99)
Potassium: 3.7 mmol/L (ref 3.5–5.1)
Sodium: 139 mmol/L (ref 135–145)

## 2019-09-04 LAB — CBC
HCT: 25.3 % — ABNORMAL LOW (ref 39.0–52.0)
Hemoglobin: 8.7 g/dL — ABNORMAL LOW (ref 13.0–17.0)
MCH: 35.5 pg — ABNORMAL HIGH (ref 26.0–34.0)
MCHC: 34.4 g/dL (ref 30.0–36.0)
MCV: 103.3 fL — ABNORMAL HIGH (ref 80.0–100.0)
Platelets: 210 10*3/uL (ref 150–400)
RBC: 2.45 MIL/uL — ABNORMAL LOW (ref 4.22–5.81)
RDW: 18.3 % — ABNORMAL HIGH (ref 11.5–15.5)
WBC: 4.9 10*3/uL (ref 4.0–10.5)
nRBC: 0 % (ref 0.0–0.2)

## 2019-09-04 LAB — PHOSPHORUS: Phosphorus: 1.7 mg/dL — ABNORMAL LOW (ref 2.5–4.6)

## 2019-09-04 LAB — GLUCOSE, CAPILLARY
Glucose-Capillary: 35 mg/dL — CL (ref 70–99)
Glucose-Capillary: 89 mg/dL (ref 70–99)
Glucose-Capillary: 107 mg/dL — ABNORMAL HIGH (ref 70–99)
Glucose-Capillary: 90 mg/dL (ref 70–99)
Glucose-Capillary: 106 mg/dL — ABNORMAL HIGH (ref 70–99)

## 2019-09-04 LAB — MAGNESIUM: Magnesium: 1.7 mg/dL (ref 1.7–2.4)

## 2019-09-04 MED ORDER — BENZOCAINE 10 % MT GEL
Freq: Four times a day (QID) | OROMUCOSAL | Status: DC
  Filled 2019-09-04: qty 9

## 2019-09-04 MED ORDER — TRAMADOL HCL 50 MG PO TABS
50.0000 mg | ORAL_TABLET | Freq: Four times a day (QID) | ORAL | Status: DC | PRN
  Administered 2019-09-04 – 2019-09-05 (×4): 50 mg via ORAL
  Filled 2019-09-04 (×4): qty 1

## 2019-09-04 MED ORDER — POTASSIUM PHOSPHATE MONOBASIC 500 MG PO TABS
500.0000 mg | ORAL_TABLET | Freq: Three times a day (TID) | ORAL | Status: DC
  Administered 2019-09-04 – 2019-09-05 (×5): 500 mg via ORAL
  Filled 2019-09-04 (×6): qty 1

## 2019-09-04 NOTE — TOC Initial Note (Signed)
Transition of Care [facility]) - Initial/Assessment Note    Patient Details  Name: Eugene Strong MRN: [id number] Date of Birth: July 22, 1951  [facility]) CM/SW Contact:    Shelbie Hutching, RN Phone Number: 09/04/2019, 12:01 PM  Clinical Narrative:                 Patient admitted with a stroke.  Patient is independent at home and reports that he lives alone.  At discharge patient reports he will be going to stay with his sister, Eugene Strong.  Patient has no DME at home and PT has recommended a RW and bedside commode.  DME agency will be [employer] and equipment will be brought to patient's room before discharge.  Patient agrees to home health at discharge and has no preference in agency.  Floydene Flock with [facility] given home health referral for PT and OT.     Expected Discharge Plan: [place] Barriers to Discharge: Continued Medical Work up   Patient Goals and CMS Choice Patient states their goals for this hospitalization and ongoing recovery are:: to get home CMS Medicare.gov Compare Post Acute Care list provided to:: Patient Choice offered to / list presented to : Patient  Expected Discharge Plan and Services Expected Discharge Plan: [place]   Discharge Planning Services: CM Consult Post Acute Care Choice: [place] arrangements for the past 2 months: [place]                 DME Arranged: 3-N-1, Walker rolling DME Agency: [employer] Date DME Agency Contacted: 09/04/19 Time DME Agency Contacted: 1200 Representative spoke with at DME Agency: [place] Arranged: PT, OT [place] Agency: [place] ([place]) Date [place]: 09/04/19 Time [place]: 1200 Representative spoke with at [place]: Floydene Flock  Prior Living Arrangements/Services Living arrangements for the past 2 months: [place] with:: Self Patient language and need for interpreter reviewed:: Yes Do  you feel safe going back to the place where you live?: Yes      Need for Family Participation in Patient Care: Yes (Comment)(stroke) Care giver support system in place?: Yes (comment)(sister)   Criminal Activity/Legal Involvement Pertinent to Current Situation/Hospitalization: No - Comment as needed  Activities of Daily Living Home Assistive Devices/Equipment: None ADL Screening (condition at time of admission) Patient's cognitive ability adequate to safely complete daily activities?: Yes Is the patient deaf or have difficulty hearing?: No Does the patient have difficulty seeing, even when wearing glasses/contacts?: No Does the patient have difficulty concentrating, remembering, or making decisions?: No Patient able to express need for assistance with ADLs?: Yes Does the patient have difficulty dressing or bathing?: No Independently performs ADLs?: Yes (appropriate for developmental age) Does the patient have difficulty walking or climbing stairs?: Yes Weakness of Legs: Both Weakness of Arms/Hands: None  Permission Sought/Granted Permission sought to share information with : Case Manager, Family Supports, Other (comment) Permission granted to share information with : Yes, Verbal Permission Granted     Permission granted to share info w AGENCY: [place] granted to share info w Relationship: sister Eugene Strong     Emotional Assessment Appearance:: Appears stated age Attitude/Demeanor/Rapport: Engaged Affect (typically observed): Accepting Orientation: : Oriented to Self, Oriented to Place, Oriented to  Time, Oriented to Situation Alcohol / Substance Use: Not Applicable Psych Involvement: No (comment)  Admission diagnosis:  Weakness [R53.1] Hypoglycemia [E16.2] Constipated [K59.00] Stroke (  [place]) [I63.9] Cerebrovascular accident (CVA), unspecified mechanism ([place]) [I63.9] Patient Active Problem List   Diagnosis Date Noted  . Protein-calorie malnutrition, severe  09/03/2019  . HTN (hypertension) 09/01/2019  . Protein-calorie malnutrition, moderate ([place]) 09/01/2019  . Stroke ([place])   . Hypoglycemia    PCP:  Patient, No Pcp Per Pharmacy:   [facility] [address] ([address]), [place]) [place] [address] Phone: [phone] Fax: [phone]     Social Determinants of Health (SDOH) Interventions    Readmission Risk Interventions No flowsheet data found.

## 2019-09-04 NOTE — Progress Notes (Signed)
Pt visited for cognitive screen. Sister present and supportive. Pt was able to communicate needs and wants without evidence of Aphasia. Speech sounded as if Pt wasn't swallowing saliva. Pt reported he has a lot of phlegm. Observed with a few sips of thin tea. Pt coughed after 2nd sip from a straw. Coughing had barely any effort and was not productive. Pt reports he is unable to cough hard. Noted pt turns his head to the right when attempted to swallow foods and liquids. He stated he has to do this to get the foods down. Rec Mod ba swallow to further assess swallowing abilities.orders obtained for swalow eval and MBSS. Significant weight loss.

## 2019-09-04 NOTE — Consult Note (Signed)
Subjective: No new neurological complaints  Objective: Current vital signs: BP (!) 127/58 (BP Location: Left Arm)   Pulse 64   Temp (!) 97.4 F (36.3 C) (Oral)   Resp 18   Ht 6\' 2"  (1.88 m)   Wt 40.8 kg   SpO2 100%   BMI 11.56 kg/m  Vital signs in last 24 hours: Temp:  [97.4 F (36.3 C)-98.4 F (36.9 C)] 97.4 F (36.3 C) (12/21 0814) Pulse Rate:  [64-72] 64 (12/21 0814) Resp:  [16-18] 18 (12/21 0814) BP: (113-131)/(55-63) 127/58 (12/21 0814) SpO2:  [98 %-100 %] 100 % (12/21 0814)  Intake/Output from previous day: 12/20 0701 - 12/21 0700 In: 480 [P.O.:480] Out: -  Intake/Output this shift: Total I/O In: 240 [P.O.:240] Out: -  Nutritional status:  Diet Order            DIET DYS 3 Room service appropriate? Yes; Fluid consistency: Thin  Diet effective now              Neurologic Exam: Mental Status: Alert.  Oriented.  Speech fluent without evidence of aphasia.  Able to follow 3 step commands without difficulty. Cranial Nerves: II: Visual fields grossly normal, pupils equal, round, reactive to light and accommodation III,IV, VI: ptosis not present, extra-ocular motions intact bilaterally V,VII: smile symmetric, facial light touch sensation normal bilaterally VIII: hearing normal bilaterally IX,X: gag reflex present XI: bilateral shoulder shrug XII: midline tongue extension Motor: Right :  Upper extremity   5-/5                                      Left:    Upper extremity   5/5             Lower extremity   4/5                                                  Lower extremity   5/5 Tone and bulk:normal tone throughout; no atrophy noted   Lab Results: Basic Metabolic Panel: Recent Labs  Lab 09/01/19 1038 09/04/19 0531  NA 140 139  K 4.1 3.7  CL 107 106  CO2 20* 25  GLUCOSE 107* 92  BUN 23 16  CREATININE 0.91 0.52*  CALCIUM 10.0 8.5*  MG  --  1.7  PHOS  --  1.7*    Liver Function Tests: Recent Labs  Lab 09/01/19 1038  AST 23  ALT 9  ALKPHOS  48  BILITOT 0.6  PROT 7.5  ALBUMIN 3.8   No results for input(s): LIPASE, AMYLASE in the last 168 hours. No results for input(s): AMMONIA in the last 168 hours.  CBC: Recent Labs  Lab 09/01/19 1038 09/04/19 0531  WBC 7.5 4.9  NEUTROABS 5.5  --   HGB 12.4* 8.7*  HCT 36.1* 25.3*  MCV 102.6* 103.3*  PLT 305 210    Cardiac Enzymes: No results for input(s): CKTOTAL, CKMB, CKMBINDEX, TROPONINI in the last 168 hours.  Lipid Panel: Recent Labs  Lab 09/02/19 0613  CHOL 161  TRIG 137  HDL 49  CHOLHDL 3.3  VLDL 27  LDLCALC 85    CBG: Recent Labs  Lab 09/03/19 0759 09/03/19 1139 09/03/19 1636 09/03/19 2135 09/04/19 0816  GLUCAP 84 91 96 103* 89  Microbiology: Results for orders placed or performed during the hospital encounter of 09/01/19  SARS CORONAVIRUS 2 (TAT 6-24 HRS) Nasopharyngeal Nasopharyngeal Swab     Status: None   Collection Time: 09/01/19 12:28 PM   Specimen: Nasopharyngeal Swab  Result Value Ref Range Status   SARS Coronavirus 2 NEGATIVE NEGATIVE Final    Comment: (NOTE) SARS-CoV-2 target nucleic acids are NOT DETECTED. The SARS-CoV-2 RNA is generally detectable in upper and lower respiratory specimens during the acute phase of infection. Negative results do not preclude SARS-CoV-2 infection, do not rule out co-infections with other pathogens, and should not be used as the sole basis for treatment or other patient management decisions. Negative results must be combined with clinical observations, patient history, and epidemiological information. The expected result is Negative. Fact Sheet for Patients: SugarRoll.be Fact Sheet for Healthcare Providers: https://www.woods-mathews.com/ This test is not yet approved or cleared by the [place] FDA and  has been authorized for detection and/or diagnosis of SARS-CoV-2 by FDA under an Emergency Use Authorization (EUA). This EUA will remain  in effect  (meaning this test can be used) for the duration of the COVID-19 declaration under Section 56 4(b)(1) of the Act, 21 U.S.C. section 360bbb-3(b)(1), unless the authorization is terminated or revoked sooner. Performed at [facility], [place] [address] [place], [place] [address]     Coagulation Studies: No results for input(s): LABPROT, INR in the last 72 hours.  Imaging: CT ANGIO NECK W OR WO CONTRAST  Result Date: 09/03/2019 CLINICAL DATA:  Stroke follow-up.  Carotid stenosis by Doppler EXAM: CT ANGIOGRAPHY NECK TECHNIQUE: Multidetector CT imaging of the neck was performed using the standard protocol during bolus administration of intravenous contrast. Multiplanar CT image reconstructions and MIPs were obtained to evaluate the vascular anatomy. Carotid stenosis measurements (when applicable) are obtained utilizing NASCET criteria, using the distal internal carotid diameter as the denominator. CONTRAST:  43mL OMNIPAQUE IOHEXOL 350 MG/ML SOLN COMPARISON:  Carotid Doppler from yesterday FINDINGS: Aortic arch: Multifocal atherosclerotic plaque. Three vessel branching. Right carotid system: Scattered mainly calcified atherosclerotic plaque along the common carotid and proximal ICA. Proximal ICA stenosis measures up to 45% as measured on reformats. No beading or ulceration. Carotid siphon atherosclerotic plaque without suspected flow reducing stenosis. The right ICA is larger than the left at least partially due to circle-of-Willis variant Left carotid system: Calcified plaque at the left common carotid origin without flow reducing stenosis. Multifocal calcified plaque along the common carotid with bulky low-density plaque along the posterior wall at the ICA bulb causing severe stenosis with short segment string sign. The downstream vessel may be under filled. Carotid siphon atherosclerotic plaque without flow reducing stenosis. Vertebral arteries: Proximal subclavian atherosclerosis without flow  reducing stenosis. High-grade atheromatous narrowing at both vertebral origins, worse on the non dominant right side which is not seen beyond the PICA until reconstitution at the distal V4 segment. Skeleton: Degenerative changes in the cervical spine. 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 Poor dentition. Other neck: No acute finding Upper chest: COPD changes. IMPRESSION: 1. Critical proximal left ICA stenosis with short segment string sign. 2. 45% proximal right ICA atheromatous stenosis. 3. High-grade narrowing at both vertebral origins with occluded V4 segment on the non dominant right side. 4. 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 Electronically Signed   By: Monte Fantasia M.D.   On: 09/03/2019 12:39   US Carotid Bilateral (at [facility] and [facility]  only)  Result Date: 09/02/2019 CLINICAL DATA:  Left-sided weakness and cerebral infarction. EXAM: BILATERAL CAROTID DUPLEX ULTRASOUND TECHNIQUE: Pearline Cables scale imaging, color Doppler and duplex ultrasound were performed of bilateral carotid and vertebral arteries in the neck. COMPARISON:  None. FINDINGS: Criteria: Quantification of carotid stenosis is based on velocity parameters that correlate the residual internal carotid diameter with NASCET-based stenosis levels, using the diameter of the distal internal carotid lumen as the denominator for stenosis measurement. The following velocity measurements were obtained: RIGHT ICA:  154/51 distal, 69/18 proximal cm/sec CCA:  0000000 cm/sec SYSTOLIC ICA/CCA RATIO:  1.8 ECA:  87 cm/sec LEFT ICA:  390/133 cm/sec CCA:  [id number] cm/sec SYSTOLIC ICA/CCA RATIO:  7.3 ECA:  99 cm/sec RIGHT CAROTID ARTERY: Moderate calcified plaque in the distal common carotid artery, carotid bulb and internal carotid artery. Estimated proximal right ICA stenosis is less than 50%. RIGHT VERTEBRAL ARTERY: Antegrade flow with normal waveform and velocity. LEFT CAROTID ARTERY: Severe  amount of predominately calcified plaque is present at the level of the distal bulb and proximal left ICA. Turbulent flow and elevated velocities correspond to an estimated greater than 70% left ICA stenosis. LEFT VERTEBRAL ARTERY: Antegrade flow with normal waveform and velocity. IMPRESSION: 1. Significant estimated greater than 70% proximal left ICA stenosis. 2. Estimated less than 50% proximal right ICA stenosis. Electronically Signed   By: Aletta Edouard M.D.   On: 09/02/2019 13:40   DG Abd Portable 1V  Result Date: 09/04/2019 CLINICAL DATA:  68 year old male with constipation. Suspected ileus. EXAM: PORTABLE ABDOMEN - 1 VIEW COMPARISON:  Abdominal radiographs 09/02/2019. CTA neck yesterday. FINDINGS: 2 supine views of the abdomen and pelvis at 0755 hours. Excreted IV contrast in the urinary bladder. Decreased small bowel gas and non obstructed bowel gas pattern currently. Increased volume of retained stool in the right colon. Paucity of distal stool. Abdominal visceral contours are within normal limits. Probable pulmonary hyperinflation but otherwise negative visible lung bases. Degenerative endplate spurring with disc space loss in the lumbar spine. No acute osseous abnormality identified. Calcified iliac atherosclerosis. IMPRESSION: 1. Normalized bowel gas pattern, no evidence of bowel obstruction. Increased retained stool in the right colon, but paucity of distal stool 2. Calcified atherosclerosis. Electronically Signed   By: Genevie Ann M.D.   On: 09/04/2019 08:26   ECHOCARDIOGRAM COMPLETE  Result Date: 09/02/2019   ECHOCARDIOGRAM REPORT   Patient Name:   Eugene Strong Date of Exam: 09/02/2019 Medical Rec #:  [id number]      Height:       74.0 in Accession #:    [id number]     Weight:       90.0 lb Date of Birth:  Sep 20, 1950      BSA:          1.55 m Patient Age:    46 years       BP:           146/72 mmHg Patient Gender: M              HR:           67 bpm. Exam Location:  [facility] Procedure: 2D Echo  Indications:     Stroke 434.91/I163.9  History:         Patient has no prior history of Echocardiogram examinations.                  Risk Factors:Hypertension.  Sonographer:     Avanell Shackleton Referring Phys:  Unknown Foley NIU Diagnosing  Phys: Skeet Latch MD  Sonographer Comments: Technically difficult study due to poor echo windows. IMPRESSIONS  1. Left ventricular ejection fraction, by visual estimation, is 60 to 65%. The left ventricle has normal function. Left ventricular septal wall thickness was normal. Normal left ventricular posterior wall thickness. There is no left ventricular hypertrophy.  2. Left ventricular diastolic parameters are consistent with Grade II diastolic dysfunction (pseudonormalization).  3. The left ventricle has no regional wall motion abnormalities.  4. Global right ventricle has normal systolic function.The right ventricular size is normal. No increase in right ventricular wall thickness.  5. Left atrial size was normal.  6. Right atrial size was normal.  7. The mitral valve is normal in structure. No evidence of mitral valve regurgitation. No evidence of mitral stenosis.  8. The tricuspid valve is normal in structure. Tricuspid valve regurgitation is mild.  9. The aortic valve is normal in structure. Aortic valve regurgitation is not visualized. No evidence of aortic valve sclerosis or stenosis. 10. The pulmonic valve was normal in structure. Pulmonic valve regurgitation is not visualized. 11. Normal pulmonary artery systolic pressure. 12. The inferior vena cava is normal in size with greater than 50% respiratory variability, suggesting right atrial pressure of 3 mmHg. FINDINGS  Left Ventricle: Left ventricular ejection fraction, by visual estimation, is 60 to 65%. The left ventricle has normal function. The left ventricle has no regional wall motion abnormalities. Normal left ventricular posterior wall thickness. There is no left ventricular hypertrophy. Left ventricular diastolic  parameters are consistent with Grade II diastolic dysfunction (pseudonormalization). Indeterminate filling pressures. Right Ventricle: The right ventricular size is normal. No increase in right ventricular wall thickness. Global RV systolic function is has normal systolic function. The tricuspid regurgitant velocity is 2.43 m/s, and with an assumed right atrial pressure  of 3 mmHg, the estimated right ventricular systolic pressure is normal at 26.7 mmHg. Left Atrium: Left atrial size was normal in size. Right Atrium: Right atrial size was normal in size Pericardium: There is no evidence of pericardial effusion. Mitral Valve: The mitral valve is normal in structure. No evidence of mitral valve regurgitation. No evidence of mitral valve stenosis by observation. Tricuspid Valve: The tricuspid valve is normal in structure. Tricuspid valve regurgitation is mild. Aortic Valve: The aortic valve is normal in structure. Aortic valve regurgitation is not visualized. The aortic valve is structurally normal, with no evidence of sclerosis or stenosis. Pulmonic Valve: The pulmonic valve was normal in structure. Pulmonic valve regurgitation is not visualized. Pulmonic regurgitation is not visualized. Aorta: The aortic root, ascending aorta and aortic arch are all structurally normal, with no evidence of dilitation or obstruction. Venous: The inferior vena cava is normal in size with greater than 50% respiratory variability, suggesting right atrial pressure of 3 mmHg. IAS/Shunts: No atrial level shunt detected by color flow Doppler. There is no evidence of a patent foramen ovale. No ventricular septal defect is seen or detected. There is no evidence of an atrial septal defect.  LEFT VENTRICLE PLAX 2D LVIDd:         2.54 cm  Diastology LVIDs:         1.25 cm  LV e' lateral:   7.62 cm/s LV PW:         0.90 cm  LV E/e' lateral: 9.6 LV IVS:        0.84 cm  LV e' medial:    6.20 cm/s LVOT diam:     2.00 cm  LV E/e' medial:  11.7 LV SV:          19 ml LV SV Index:   13.70 LVOT Area:     3.14 cm  RIGHT VENTRICLE             IVC RV S prime:     13.40 cm/s  IVC diam: 1.30 cm TAPSE (M-mode): 2.1 cm LEFT ATRIUM             Index       RIGHT ATRIUM           Index LA diam:        2.30 cm 1.49 cm/m  RA Area:     10.30 cm LA Vol (A2C):   29.0 ml 18.73 ml/m RA Volume:   21.90 ml  14.15 ml/m LA Vol (A4C):   14.8 ml 9.56 ml/m LA Biplane Vol: 22.3 ml 14.41 ml/m   AORTA Ao Root diam: 3.20 cm MITRAL VALVE                        TRICUSPID VALVE MV Area (PHT): 3.37 cm             TR Peak grad:   23.7 mmHg MV PHT:        65.25 msec           TR Vmax:        273.00 cm/s MV Decel Time: 225 msec MV E velocity: 72.80 cm/s 103 cm/s  SHUNTS MV A velocity: 58.20 cm/s 70.3 cm/s Systemic Diam: 2.00 cm MV E/A ratio:  1.25       1.5  Skeet Latch MD Electronically signed by Skeet Latch MD Signature Date/Time: 09/02/2019/2:32:21 PM    Final     Medications:  I have reviewed the patient's current medications. Scheduled: . aspirin EC  81 mg Oral Daily  . atorvastatin  80 mg Oral q1800  . benzocaine   Mouth/Throat QID  . clopidogrel  75 mg Oral Daily  . enoxaparin (LOVENOX) injection  30 mg Subcutaneous Q24H  . feeding supplement (ENSURE ENLIVE)  237 mL Oral TID WC  . insulin aspart  0-6 Units Subcutaneous TID WC  . magic mouthwash  15 mL Oral QID  . mouth rinse  15 mL Mouth Rinse BID  . polyethylene glycol  17 g Oral Daily  . potassium phosphate (monobasic)  500 mg Oral TID WC & HS    Assessment/Plan: 68 y.o. male presenting with complaints of weakness.  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  Concern is for embolic etiology.  Patient on no medications prior to admission.  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  Carotid dopplers show no evidence of hemodynamically significant stenosis on the right with >70% stenosis on the left.    CTA consistent with significant L ICA stenosis   - Vascular evaluation pending - Pt is to continue ASA and plavix unless surgical intervention  - If no vascular intervention, suspect d/c planning

## 2019-09-04 NOTE — Consult Note (Signed)
Patient off the floor at this time.  Will return later today to see as a consult.

## 2019-09-04 NOTE — Consult Note (Signed)
[facility] Vascular Consult Note  MRN : [id number]  Eugene Strong is a 68 y.o. ([birth year]) male who presents with chief complaint of  Chief Complaint  Patient presents with  . Weakness  .  History of Present Illness: I am asked to see the patient by Dr. Posey Pronto for carotid stenosis with acute stroke.  The patient was admitted to the hospital couple of days ago with acute speech and swallowing difficulties as well as right arm weakness and numbness.  His extremity symptoms have largely resolved over the last 24 to 48 hours but he continues to have some speech difficulties.  He has a previous history of stroke with similar symptoms in the past.  No fevers or chills.  No chest pain or shortness of breath.  He has undergone a CT angiogram which I have independently reviewed.  He has mild right carotid artery stenosis and very high-grade left carotid artery stenosis that I would interpret as greater than 90%.  His lesion is extensively calcified and very long on the CT scan as well.  Current Facility-Administered Medications  Medication Dose Route Frequency Provider Last Rate Last Admin  . acetaminophen (TYLENOL) tablet 650 mg  650 mg Oral Q4H PRN Ivor Costa, MD   650 mg at 09/04/19 0403   Or  . acetaminophen (TYLENOL) 160 MG/5ML solution 650 mg  650 mg Per Tube Q4H PRN Ivor Costa, MD       Or  . acetaminophen (TYLENOL) suppository 650 mg  650 mg Rectal Q4H PRN Ivor Costa, MD      . aspirin EC tablet 81 mg  81 mg Oral Daily Lavina Hamman, MD   81 mg at 09/04/19 0805  . atorvastatin (LIPITOR) tablet 80 mg  80 mg Oral q1800 Ivor Costa, MD   80 mg at 09/03/19 1653  . benzocaine (ORAJEL) 10 % mucosal gel   Mouth/Throat QID Lavina Hamman, MD   Given at 09/04/19 1251  . clopidogrel (PLAVIX) tablet 75 mg  75 mg Oral Daily Lavina Hamman, MD   75 mg at 09/04/19 0805  . dextrose 50 % solution 50 mL  50 mL Intravenous PRN Ivor Costa, MD   50 mL at 09/01/19 2036  . enoxaparin  (LOVENOX) injection 30 mg  30 mg Subcutaneous Q24H Lavina Hamman, MD   30 mg at 09/03/19 1949  . feeding supplement (ENSURE ENLIVE) (ENSURE ENLIVE) liquid 237 mL  237 mL Oral TID WC Ivor Costa, MD   237 mL at 09/04/19 1251  . hydrALAZINE (APRESOLINE) tablet 25 mg  25 mg Oral TID PRN Ivor Costa, MD      . insulin aspart (novoLOG) injection 0-6 Units  0-6 Units Subcutaneous TID WC Lavina Hamman, MD      . lidocaine (XYLOCAINE) 2 % viscous mouth solution 15 mL  15 mL Mouth/Throat Q3H PRN Lavina Hamman, MD   15 mL at 09/03/19 0655  . magic mouthwash  15 mL Oral QID Lavina Hamman, MD   15 mL at 09/04/19 1254  . MEDLINE mouth rinse  15 mL Mouth Rinse BID Lavina Hamman, MD   15 mL at 09/04/19 0805  . ondansetron (ZOFRAN) injection 4 mg  4 mg Intravenous Q8H PRN Ivor Costa, MD      . polyethylene glycol (MIRALAX / GLYCOLAX) packet 17 g  17 g Oral Daily Lavina Hamman, MD      . potassium phosphate (monobasic) (K-PHOS ORIGINAL) tablet 500  mg  500 mg Oral TID WC & HS Lavina Hamman, MD   500 mg at 09/04/19 1251  . senna-docusate (Senokot-S) tablet 1 tablet  1 tablet Oral QHS PRN Ivor Costa, MD      . traMADol Veatrice Bourbon) tablet 50 mg  50 mg Oral Q6H PRN Lavina Hamman, MD   50 mg at 09/04/19 [id number]    Past Medical History:  Diagnosis Date  . Stroke [facility])   Malnutrition  History reviewed. No pertinent surgical history.   Social History   Tobacco Use  . Smoking status: Former Research scientist (life sciences)  . Smokeless tobacco: Never Used  Substance Use Topics  . Alcohol use: Not Currently  . Drug use: Never     Family History  Problem Relation Age of Onset  . Goiter Father   No bleeding or clotting disorders Aneurysms  Allergies  Allergen Reactions  . Codeine   . Penicillins      REVIEW OF SYSTEMS (Negative unless checked)  Constitutional: [] Weight loss  [] Fever  [] Chills Cardiac: [] Chest pain   [] Chest pressure   [] Palpitations   [] Shortness of breath when laying flat   [] Shortness of breath  at rest   [] Shortness of breath with exertion. Vascular:  [] Pain in legs with walking   [] Pain in legs at rest   [] Pain in legs when laying flat   [] Claudication   [] Pain in feet when walking  [] Pain in feet at rest  [] Pain in feet when laying flat   [] History of DVT   [] Phlebitis   [] Swelling in legs   [] Varicose veins   [] Non-healing ulcers Pulmonary:   [] Uses home oxygen   [] Productive cough   [] Hemoptysis   [] Wheeze  [] COPD   [] Asthma Neurologic:  [] Dizziness  [] Blackouts   [] Seizures   [x] History of stroke   [x] History of TIA  [] Aphasia   [] Temporary blindness   [] Dysphagia   [] Weakness or numbness in arms   [] Weakness or numbness in legs Musculoskeletal:  [x] Arthritis   [] Joint swelling   [] Joint pain   [] Low back pain Hematologic:  [] Easy bruising  [] Easy bleeding   [] Hypercoagulable state   [] Anemic  [] Hepatitis Gastrointestinal:  [] Blood in stool   [] Vomiting blood  [x] Gastroesophageal reflux/heartburn   [] Difficulty swallowing. Genitourinary:  [] Chronic kidney disease   [] Difficult urination  [] Frequent urination  [] Burning with urination   [] Blood in urine Skin:  [] Rashes   [] Ulcers   [] Wounds Psychological:  [] History of anxiety   []  History of major depression.  Physical Examination  Vitals:   09/03/19 2233 09/04/19 0406 09/04/19 0814 09/04/19 1200  BP: (!) 123/55 (!) 131/59 (!) 127/58 (!) 122/59  Pulse: 64 70 64 72  Resp: 16 16 18 18   Temp: (!) 97.5 F (36.4 C) 97.6 F (36.4 C) (!) 97.4 F (36.3 C)   TempSrc: Oral Oral Oral   SpO2: 99% 99% 100% 100%  Weight:      Height:       Body mass index is 11.56 kg/m. Gen:  Very thin, emaciated in fact, NAD Head: [place]/AT, No temporalis wasting.  Ear/Nose/Throat: Hearing grossly intact, nares w/o erythema or drainage, dentition is poor Eyes: Sclera non-icteric, conjunctiva clear Neck: Trachea midline.  No JVD. Left carotid bruit Pulmonary:  Good air movement, respirations not labored, equal bilaterally.  Cardiac: RRR, normal S1,  S2. Vascular:  Vessel Right Left  Radial Palpable Palpable   Gastrointestinal: soft, non-tender/non-distended. No guarding/reflex.  Musculoskeletal: No appreciable extremity weakness today.  Extremities without ischemic changes.  No deformity or atrophy. No edema. Neurologic: Sensation grossly intact in extremities.  Symmetrical.  Speech is somewhat labored although not bad. Motor exam as listed above. Psychiatric: Judgment intact, Mood & affect appropriate for pt's clinical situation. Dermatologic: No rashes or ulcers noted.  No cellulitis or open wounds.       CBC Lab Results  Component Value Date   WBC 4.9 09/04/2019   HGB 8.7 (L) 09/04/2019   HCT 25.3 (L) 09/04/2019   MCV 103.3 (H) 09/04/2019   PLT 210 09/04/2019    BMET    Component Value Date/Time   NA 139 09/04/2019 0531   K 3.7 09/04/2019 0531   CL 106 09/04/2019 0531   CO2 25 09/04/2019 0531   GLUCOSE 92 09/04/2019 0531   BUN 16 09/04/2019 0531   CREATININE 0.52 (L) 09/04/2019 0531   CALCIUM 8.5 (L) 09/04/2019 0531   GFRNONAA >60 09/04/2019 0531   GFRAA >60 09/04/2019 0531   Estimated Creatinine Clearance: 50.3 mL/min (A) (by C-G formula based on SCr of 0.52 mg/dL (L)).  COAG Lab Results  Component Value Date   INR 0.9 09/01/2019    Radiology CT HEAD WO CONTRAST  Result Date: 09/01/2019 CLINICAL DATA:  Left-sided weakness EXAM: CT HEAD WITHOUT CONTRAST TECHNIQUE: Contiguous axial images were obtained from the base of the skull through the vertex without intravenous contrast. COMPARISON:  None. FINDINGS: Brain: There is mild diffuse atrophy. There is no intracranial mass, hemorrhage, extra-axial fluid collection, or midline shift. There is mild small vessel disease in the centra semiovale bilaterally. There is an area of decreased attenuation at the gray-white junction of the mid right frontal lobe, a finding concerning for recent and potentially acute infarct. There is decreased attenuation in the head of  the caudate nucleus on the right and immediately adjacent anterior limb of the right external capsule. This area may represent an older infarct. No other findings suggesting potential acute infarct present. Vascular: No hyperdense vessel. There is calcification in each carotid siphon region. Skull: The bony calvarium appears intact. Sinuses/Orbits: There is mucosal thickening involving multiple ethmoid air cells. Other visualized paranasal sinuses are clear. There is a concha bullosa on the right, an anatomic variant. There is leftward deviation of the nasal septum. Visualized orbits appear symmetric bilaterally. Other: Visualized mastoid air cells are clear. IMPRESSION: 1. Atrophy with periventricular small vessel disease. Age uncertain but potentially recent/acute infarct in the mid right frontal lobe at the gray-white junction. Older appearing infarct involving a portion of the head of the caudate nucleus on the right in the immediately adjacent anterior limb of the right external capsule. No mass or hemorrhage. 2.  There are foci of arterial vascular calcification. 3. Mucosal thickening in several ethmoid air cells. Deviated nasal septum. Electronically Signed   By: Lowella Grip III M.D.   On: 09/01/2019 11:37   CT ANGIO NECK W OR WO CONTRAST  Result Date: 09/03/2019 CLINICAL DATA:  Stroke follow-up.  Carotid stenosis by Doppler EXAM: CT ANGIOGRAPHY NECK TECHNIQUE: Multidetector CT imaging of the neck was performed using the standard protocol during bolus administration of intravenous contrast. Multiplanar CT image reconstructions and MIPs were obtained to evaluate the vascular anatomy. Carotid stenosis measurements (when applicable) are obtained utilizing NASCET criteria, using the distal internal carotid diameter as the denominator. CONTRAST:  32mL OMNIPAQUE IOHEXOL 350 MG/ML SOLN COMPARISON:  Carotid Doppler from yesterday FINDINGS: Aortic arch: Multifocal atherosclerotic plaque. Three vessel  branching. Right carotid system: Scattered mainly calcified atherosclerotic plaque along the  common carotid and proximal ICA. Proximal ICA stenosis measures up to 45% as measured on reformats. No beading or ulceration. Carotid siphon atherosclerotic plaque without suspected flow reducing stenosis. The right ICA is larger than the left at least partially due to circle-of-Willis variant Left carotid system: Calcified plaque at the left common carotid origin without flow reducing stenosis. Multifocal calcified plaque along the common carotid with bulky low-density plaque along the posterior wall at the ICA bulb causing severe stenosis with short segment string sign. The downstream vessel may be under filled. Carotid siphon atherosclerotic plaque without flow reducing stenosis. Vertebral arteries: Proximal subclavian atherosclerosis without flow reducing stenosis. High-grade atheromatous narrowing at both vertebral origins, worse on the non dominant right side which is not seen beyond the PICA until reconstitution at the distal V4 segment. Skeleton: Degenerative changes in the cervical spine. 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 Poor dentition. Other neck: No acute finding Upper chest: COPD changes. IMPRESSION: 1. Critical proximal left ICA stenosis with short segment string sign. 2. 45% proximal right ICA atheromatous stenosis. 3. High-grade narrowing at both vertebral origins with occluded V4 segment on the non dominant right side. 4. 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 Electronically Signed   By: Monte Fantasia M.D.   On: 09/03/2019 12:39   MR ANGIO HEAD WO CONTRAST  Result Date: 09/01/2019 CLINICAL DATA:  Initial evaluation for focal neural deficit, possible stroke. EXAM: MRI HEAD WITHOUT CONTRAST MRA HEAD WITHOUT CONTRAST TECHNIQUE: Multiplanar, multiecho pulse sequences of the brain and surrounding structures were obtained  without intravenous contrast. Angiographic images of the head were obtained using MRA technique without contrast. COMPARISON:  Prior CT from earlier the same day. FINDINGS: MRI HEAD FINDINGS Brain: Diffuse prominence of the CSF containing spaces compatible with generalized age-related cerebral atrophy. Patchy T2/FLAIR hyperintensity within the periventricular deep white matter both cerebral hemispheres most consistent with chronic small vessel ischemic disease, mild to moderate nature. Few scattered superimposed remote lacunar infarcts seen involving the bilateral thalami, basal ganglia, and hemispheric cerebral white matter. Small remote cortical/subcortical infarct noted involving the anterior right frontal lobe (series 20, image 32). Punctate 3 mm focus of restricted diffusion seen involving the cortex of the left parietal lobe, consistent with a small acute ischemic infarct (series 5, image 36). Additional faint diffusion abnormality seen involving the cortex of the left occipital lobe (series 5, image 22), acute to subacute in appearance. No associated hemorrhage or mass effect. No other abnormal foci of restricted diffusion to suggest acute or subacute ischemia. Gray-white matter differentiation otherwise maintained. No acute intracranial hemorrhage. Single subcentimeter focus of susceptibility artifact noted within left parietal lobe, consistent with a small microhemorrhage, likely small vessel related. No mass lesion, midline shift or mass effect. No hydrocephalus. No extra-axial fluid collection. Pituitary gland suprasellar region within normal limits. Midline structures intact and normal. Vascular: Major intracranial vascular flow voids are maintained. Skull and upper cervical spine: Craniocervical junction within normal limits. Bone marrow signal intensity normal. No scalp soft tissue abnormality. Sinuses/Orbits: Globes and orbital soft tissues within normal limits. Paranasal sinuses are largely clear.  Right-to-left nasal septal deviation with associated concha bullosa noted. 8 mm well-circumscribed T2 hyperintense nodule at the left nasal bridge, indeterminate (series 15, image 7). Other: No mastoid effusion.  Inner ear structures grossly normal. MRA HEAD FINDINGS ANTERIOR CIRCULATION: Visualized distal cervical segments of the internal carotid arteries are widely patent with symmetric antegrade flow. Petrous segments  widely patent. Multifocal atheromatous irregularity throughout the cavernous/supraclinoid ICAs without high-grade flow-limiting stenosis, slightly worse on the right. ICA termini well perfused. Right A1 widely patent. Short-segment severe stenosis noted at the proximal left M1 segment (series 9, image 106). Normal anterior communicating artery. Anterior cerebral arteries widely patent to their distal aspects without high-grade stenosis. No M1 stenosis or occlusion. Normal MCA bifurcations. Distal MCA branches well perfused and symmetric, although demonstrate diffuse small vessel atheromatous irregularity. POSTERIOR CIRCULATION: Dominant left vertebral artery widely patent to the vertebrobasilar junction. Patent left PICA. Right vertebral artery diffusely hypoplastic and not well seen, although there is flow related signal within the distal right V4 segment with perfusion of the right PICA. Basilar widely patent to its distal aspect without stenosis. Superior cerebral arteries patent bilaterally. Left PCA is supplied primarily via the basilar. Right PCA supplied via the basilar as well as a robust right posterior communicating artery. Multifocal atheromatous irregularity throughout both PCAs, which are both patent to their distal aspects. Short-segment severe distal right P3 stenosis noted (series 1059, image 6). No intracranial aneurysm or other vascular abnormality. IMPRESSION: MRI HEAD IMPRESSION: 1. Few tiny subcentimeter acute to early subacute cortical infarcts involving the left parietal and  occipital lobes as above. No associated hemorrhage. 2. Underlying age-related cerebral atrophy with moderate chronic microvascular ischemic disease. 3. 8 mm nodule involving the skin of the left nasal bridge, indeterminate. Correlation with physical exam recommended. MRA HEAD IMPRESSION: 1. No large vessel occlusion. 2. 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 No other proximal high-grade or correctable stenosis. Electronically Signed   By: Jeannine Boga M.D.   On: 09/01/2019 22:01   MR BRAIN WO CONTRAST  Result Date: 09/01/2019 CLINICAL DATA:  Initial evaluation for focal neural deficit, possible stroke. EXAM: MRI HEAD WITHOUT CONTRAST MRA HEAD WITHOUT CONTRAST TECHNIQUE: Multiplanar, multiecho pulse sequences of the brain and surrounding structures were obtained without intravenous contrast. Angiographic images of the head were obtained using MRA technique without contrast. COMPARISON:  Prior CT from earlier the same day. FINDINGS: MRI HEAD FINDINGS Brain: Diffuse prominence of the CSF containing spaces compatible with generalized age-related cerebral atrophy. Patchy T2/FLAIR hyperintensity within the periventricular deep white matter both cerebral hemispheres most consistent with chronic small vessel ischemic disease, mild to moderate nature. Few scattered superimposed remote lacunar infarcts seen involving the bilateral thalami, basal ganglia, and hemispheric cerebral white matter. Small remote cortical/subcortical infarct noted involving the anterior right frontal lobe (series 20, image 32). Punctate 3 mm focus of restricted diffusion seen involving the cortex of the left parietal lobe, consistent with a small acute ischemic infarct (series 5, image 36). Additional faint diffusion abnormality seen involving the cortex of the left occipital lobe (series 5, image 22), acute to subacute in appearance. No associated hemorrhage or mass  effect. No other abnormal foci of restricted diffusion to suggest acute or subacute ischemia. Gray-white matter differentiation otherwise maintained. No acute intracranial hemorrhage. Single subcentimeter focus of susceptibility artifact noted within left parietal lobe, consistent with a small microhemorrhage, likely small vessel related. No mass lesion, midline shift or mass effect. No hydrocephalus. No extra-axial fluid collection. Pituitary gland suprasellar region within normal limits. Midline structures intact and normal. Vascular: Major intracranial vascular flow voids are maintained. Skull and upper cervical spine: Craniocervical junction within normal limits. Bone marrow signal intensity normal. No scalp soft tissue abnormality. Sinuses/Orbits: Globes and orbital soft tissues within normal limits. Paranasal sinuses are largely clear. Right-to-left nasal septal  deviation with associated concha bullosa noted. 8 mm well-circumscribed T2 hyperintense nodule at the left nasal bridge, indeterminate (series 15, image 7). Other: No mastoid effusion.  Inner ear structures grossly normal. MRA HEAD FINDINGS ANTERIOR CIRCULATION: Visualized distal cervical segments of the internal carotid arteries are widely patent with symmetric antegrade flow. Petrous segments widely patent. Multifocal atheromatous irregularity throughout the cavernous/supraclinoid ICAs without high-grade flow-limiting stenosis, slightly worse on the right. ICA termini well perfused. Right A1 widely patent. Short-segment severe stenosis noted at the proximal left M1 segment (series 9, image 106). Normal anterior communicating artery. Anterior cerebral arteries widely patent to their distal aspects without high-grade stenosis. No M1 stenosis or occlusion. Normal MCA bifurcations. Distal MCA branches well perfused and symmetric, although demonstrate diffuse small vessel atheromatous irregularity. POSTERIOR CIRCULATION: Dominant left vertebral artery  widely patent to the vertebrobasilar junction. Patent left PICA. Right vertebral artery diffusely hypoplastic and not well seen, although there is flow related signal within the distal right V4 segment with perfusion of the right PICA. Basilar widely patent to its distal aspect without stenosis. Superior cerebral arteries patent bilaterally. Left PCA is supplied primarily via the basilar. Right PCA supplied via the basilar as well as a robust right posterior communicating artery. Multifocal atheromatous irregularity throughout both PCAs, which are both patent to their distal aspects. Short-segment severe distal right P3 stenosis noted (series 1059, image 6). No intracranial aneurysm or other vascular abnormality. IMPRESSION: MRI HEAD IMPRESSION: 1. Few tiny subcentimeter acute to early subacute cortical infarcts involving the left parietal and occipital lobes as above. No associated hemorrhage. 2. Underlying age-related cerebral atrophy with moderate chronic microvascular ischemic disease. 3. 8 mm nodule involving the skin of the left nasal bridge, indeterminate. Correlation with physical exam recommended. MRA HEAD IMPRESSION: 1. No large vessel occlusion. 2. 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 No other proximal high-grade or correctable stenosis. Electronically Signed   By: Jeannine Boga M.D.   On: 09/01/2019 22:01   US Carotid Bilateral (at [facility] and [facility] only)  Result Date: 09/02/2019 CLINICAL DATA:  Left-sided weakness and cerebral infarction. EXAM: BILATERAL CAROTID DUPLEX ULTRASOUND TECHNIQUE: Pearline Cables scale imaging, color Doppler and duplex ultrasound were performed of bilateral carotid and vertebral arteries in the neck. COMPARISON:  None. FINDINGS: Criteria: Quantification of carotid stenosis is based on velocity parameters that correlate the residual internal carotid diameter with NASCET-based stenosis levels, using the diameter of  the distal internal carotid lumen as the denominator for stenosis measurement. The following velocity measurements were obtained: RIGHT ICA:  154/51 distal, 69/18 proximal cm/sec CCA:  0000000 cm/sec SYSTOLIC ICA/CCA RATIO:  1.8 ECA:  87 cm/sec LEFT ICA:  390/133 cm/sec CCA:  [id number] cm/sec SYSTOLIC ICA/CCA RATIO:  7.3 ECA:  99 cm/sec RIGHT CAROTID ARTERY: Moderate calcified plaque in the distal common carotid artery, carotid bulb and internal carotid artery. Estimated proximal right ICA stenosis is less than 50%. RIGHT VERTEBRAL ARTERY: Antegrade flow with normal waveform and velocity. LEFT CAROTID ARTERY: Severe amount of predominately calcified plaque is present at the level of the distal bulb and proximal left ICA. Turbulent flow and elevated velocities correspond to an estimated greater than 70% left ICA stenosis. LEFT VERTEBRAL ARTERY: Antegrade flow with normal waveform and velocity. IMPRESSION: 1. Significant estimated greater than 70% proximal left ICA stenosis. 2. Estimated less than 50% proximal right ICA stenosis. Electronically Signed   By: Aletta Edouard M.D.   On: 09/02/2019 13:40   DG Chest Portable  1 View  Result Date: 09/01/2019 CLINICAL DATA:  Weakness with productive cough. EXAM: PORTABLE CHEST 1 VIEW COMPARISON:  None. FINDINGS: Lungs are hyperexpanded without consolidation or effusion. Cardiomediastinal silhouette, bones and soft tissues are normal. IMPRESSION: Hyperexpansion without acute cardiopulmonary disease. Electronically Signed   By: Marin Olp M.D.   On: 09/01/2019 11:37   DG Abd Portable 1V  Result Date: 09/04/2019 CLINICAL DATA:  67 year old male with constipation. Suspected ileus. EXAM: PORTABLE ABDOMEN - 1 VIEW COMPARISON:  Abdominal radiographs 09/02/2019. CTA neck yesterday. FINDINGS: 2 supine views of the abdomen and pelvis at 0755 hours. Excreted IV contrast in the urinary bladder. Decreased small bowel gas and non obstructed bowel gas pattern currently. Increased  volume of retained stool in the right colon. Paucity of distal stool. Abdominal visceral contours are within normal limits. Probable pulmonary hyperinflation but otherwise negative visible lung bases. Degenerative endplate spurring with disc space loss in the lumbar spine. No acute osseous abnormality identified. Calcified iliac atherosclerosis. IMPRESSION: 1. Normalized bowel gas pattern, no evidence of bowel obstruction. Increased retained stool in the right colon, but paucity of distal stool 2. Calcified atherosclerosis. Electronically Signed   By: Genevie Ann M.D.   On: 09/04/2019 08:26   DG Abd Portable 1V  Result Date: 09/02/2019 CLINICAL DATA:  Constipation, intermittent constipation. EXAM: PORTABLE ABDOMEN - 1 VIEW COMPARISON:  None. FINDINGS: Lung bases are clear. No signs of free air on supine radiography. Scattered gas-filled loops of small bowel with mild distension to moderate distension. Gas and stool in the rectum and scattered loops of gas-filled colon. No acute bone finding. IMPRESSION: Findings most suggestive of global ileus. Early partial small bowel obstruction might have a similar appearance, consider continued follow-up for further assessment. Electronically Signed   By: Zetta Bills M.D.   On: 09/02/2019 10:34   ECHOCARDIOGRAM COMPLETE  Result Date: 09/02/2019   ECHOCARDIOGRAM REPORT   Patient Name:   GURSHAWN PICKET Date of Exam: 09/02/2019 Medical Rec #:  [id number]      Height:       74.0 in Accession #:    [id number]     Weight:       90.0 lb Date of Birth:  January 01, 1951      BSA:          1.55 m Patient Age:    51 years       BP:           146/72 mmHg Patient Gender: M              HR:           67 bpm. Exam Location:  [facility] Procedure: 2D Echo Indications:     Stroke 434.91/I163.9  History:         Patient has no prior history of Echocardiogram examinations.                  Risk Factors:Hypertension.  Sonographer:     Avanell Shackleton Referring Phys:  Unknown Foley NIU Diagnosing Phys:  Skeet Latch MD  Sonographer Comments: Technically difficult study due to poor echo windows. IMPRESSIONS  1. Left ventricular ejection fraction, by visual estimation, is 60 to 65%. The left ventricle has normal function. Left ventricular septal wall thickness was normal. Normal left ventricular posterior wall thickness. There is no left ventricular hypertrophy.  2. Left ventricular diastolic parameters are consistent with Grade II diastolic dysfunction (pseudonormalization).  3. The left ventricle has no regional wall motion abnormalities.  4.  Global right ventricle has normal systolic function.The right ventricular size is normal. No increase in right ventricular wall thickness.  5. Left atrial size was normal.  6. Right atrial size was normal.  7. The mitral valve is normal in structure. No evidence of mitral valve regurgitation. No evidence of mitral stenosis.  8. The tricuspid valve is normal in structure. Tricuspid valve regurgitation is mild.  9. The aortic valve is normal in structure. Aortic valve regurgitation is not visualized. No evidence of aortic valve sclerosis or stenosis. 10. The pulmonic valve was normal in structure. Pulmonic valve regurgitation is not visualized. 11. Normal pulmonary artery systolic pressure. 12. The inferior vena cava is normal in size with greater than 50% respiratory variability, suggesting right atrial pressure of 3 mmHg. FINDINGS  Left Ventricle: Left ventricular ejection fraction, by visual estimation, is 60 to 65%. The left ventricle has normal function. The left ventricle has no regional wall motion abnormalities. Normal left ventricular posterior wall thickness. There is no left ventricular hypertrophy. Left ventricular diastolic parameters are consistent with Grade II diastolic dysfunction (pseudonormalization). Indeterminate filling pressures. Right Ventricle: The right ventricular size is normal. No increase in right ventricular wall thickness. Global RV systolic  function is has normal systolic function. The tricuspid regurgitant velocity is 2.43 m/s, and with an assumed right atrial pressure  of 3 mmHg, the estimated right ventricular systolic pressure is normal at 26.7 mmHg. Left Atrium: Left atrial size was normal in size. Right Atrium: Right atrial size was normal in size Pericardium: There is no evidence of pericardial effusion. Mitral Valve: The mitral valve is normal in structure. No evidence of mitral valve regurgitation. No evidence of mitral valve stenosis by observation. Tricuspid Valve: The tricuspid valve is normal in structure. Tricuspid valve regurgitation is mild. Aortic Valve: The aortic valve is normal in structure. Aortic valve regurgitation is not visualized. The aortic valve is structurally normal, with no evidence of sclerosis or stenosis. Pulmonic Valve: The pulmonic valve was normal in structure. Pulmonic valve regurgitation is not visualized. Pulmonic regurgitation is not visualized. Aorta: The aortic root, ascending aorta and aortic arch are all structurally normal, with no evidence of dilitation or obstruction. Venous: The inferior vena cava is normal in size with greater than 50% respiratory variability, suggesting right atrial pressure of 3 mmHg. IAS/Shunts: No atrial level shunt detected by color flow Doppler. There is no evidence of a patent foramen ovale. No ventricular septal defect is seen or detected. There is no evidence of an atrial septal defect.  LEFT VENTRICLE PLAX 2D LVIDd:         2.54 cm  Diastology LVIDs:         1.25 cm  LV e' lateral:   7.62 cm/s LV PW:         0.90 cm  LV E/e' lateral: 9.6 LV IVS:        0.84 cm  LV e' medial:    6.20 cm/s LVOT diam:     2.00 cm  LV E/e' medial:  11.7 LV SV:         19 ml LV SV Index:   13.70 LVOT Area:     3.14 cm  RIGHT VENTRICLE             IVC RV S prime:     13.40 cm/s  IVC diam: 1.30 cm TAPSE (M-mode): 2.1 cm LEFT ATRIUM             Index  RIGHT ATRIUM           Index LA diam:         2.30 cm 1.49 cm/m  RA Area:     10.30 cm LA Vol (A2C):   29.0 ml 18.73 ml/m RA Volume:   21.90 ml  14.15 ml/m LA Vol (A4C):   14.8 ml 9.56 ml/m LA Biplane Vol: 22.3 ml 14.41 ml/m   AORTA Ao Root diam: 3.20 cm MITRAL VALVE                        TRICUSPID VALVE MV Area (PHT): 3.37 cm             TR Peak grad:   23.7 mmHg MV PHT:        65.25 msec           TR Vmax:        273.00 cm/s MV Decel Time: 225 msec MV E velocity: 72.80 cm/s 103 cm/s  SHUNTS MV A velocity: 58.20 cm/s 70.3 cm/s Systemic Diam: 2.00 cm MV E/A ratio:  1.25       1.5  Skeet Latch MD Electronically signed by Skeet Latch MD Signature Date/Time: 09/02/2019/2:32:21 PM    Final    CT Maxillofacial Wo Contrast  Result Date: 09/01/2019 CLINICAL DATA:  Facial swelling EXAM: CT MAXILLOFACIAL WITHOUT CONTRAST TECHNIQUE: Multidetector CT imaging of the maxillofacial structures was performed. Multiplanar CT image reconstructions were also generated. COMPARISON:  None. FINDINGS: Osseous: No acute fracture. Bony nasal septum is deviated to the left without fracture. Orbital walls are intact. Temporomandibular joints are aligned without dislocation. 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 Right-sided lesion measures up to 2.5 cm with encroachment on the floor of mouth and superiorly deviates the anterior tongue. Remaining mandibular dentition with small periapical lucencies. No cortical destruction or periostitis. Orbits: Negative. No traumatic or inflammatory finding. Sinuses: Clear. Soft tissues: No focal soft tissue swelling. No fluid collection. Limited intracranial: See concurrent CT brain report. IMPRESSION: 1. No acute facial bone fracture. 2. Remaining mandibular dentition with small periapical lucencies. No cortical destruction or periostitis. No focal soft tissue swelling or fluid collection. 3. 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 Right-sided lesion measures up to 2.5 cm with encroachment on the floor of mouth and superior deviation of the tongue. Electronically Signed   By: Davina Poke M.D.   On: 09/01/2019 11:50      Assessment/Plan 1.  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  He also has a history of previous stroke.  I have independently reviewed his CT angiogram and he has extensive left carotid disease.  His anatomy is very poor for carotid stenting due to multiple factors.  He has proximal left carotid disease off the aortic arch which may make cannulation higher risk for embolic phenomenon.  His lesion is extensively calcified and it is very long and would require multiple stents to cover the area.  I would recommend open surgical therapy.  This would be planned for approximately 10 to 14 days from now to avoid immediate reperfusion injury with an acute stroke.  I had a long discussion today with the patient regarding the procedure itself and why we performed the procedure.  If he can obtain cardiac clearance while he is in hospital  that would be extremely helpful in getting his surgery expedited.  He can be on aspirin and Plavix and we will only hold Plavix for a few days prior to his surgery.  Would also agree with a statin agent. 2.  Low BMI likely protein calorie malnutrition.  Push p.o. intake and using supplements such as boost and Ensure would help boost his albumin and protein stores.  This would help wound healing.  3.  Dental issues.  Laboratory changes from this area could certainly worsen the progression of atherosclerosis although they are not directly related.   Leotis Pain, MD  09/04/2019 4:08 PM    This note was created with Dragon medical transcription system.  Any error is purely unintentional

## 2019-09-04 NOTE — Care Management Important Message (Signed)
Important Message  Patient Details  Name: Eugene Strong MRN: [id number] Date of Birth: 28-Sep-1950   Medicare Important Message Given:  Yes     Eugene Strong 09/04/2019, 11:56 AM

## 2019-09-04 NOTE — Evaluation (Signed)
Occupational Therapy Evaluation Patient Details Name: Eugene Strong MRN: [id number] DOB: 1950-10-18 Today's Date: 09/04/2019    History of Present Illness Pt is a 68 year old M who comes to the ED with L side weakness and slurred speech.  Imaging revealed  acute embolic left parietal occipital CVA and pt is generally weak but symptoms have mostly resolved.  [place] includes CVA affecting L side.   Clinical Impression   Pt seen for OT evaluation this date. Prior to hospital admission, pt was independent living by himself. Endorses significant mouth pain and decreased oral intake resulting from the pain. Currently pt demonstrates impairments in mouth pain (RN notified), strength, balance, and activity tolerance requiring CGA assist for LB ADL and functional ADL transfers and mobility. Fatigues quickly. Pt educated in falls prevention and energy conservation strategies, handout provided. Pt verbalized understanding; would benefit from skilled OT to address noted impairments and functional limitations (see below for any additional details) in order to maximize safety and independence while minimizing falls risk and caregiver burden.  Upon hospital discharge, recommend pt discharge to sister's home with [facility] services.    Follow Up Recommendations  Home health OT;Supervision - Intermittent    Equipment Recommendations  3 in 1 bedside commode    Recommendations for Other Services       Precautions / Restrictions Precautions Precautions: Fall Restrictions Weight Bearing Restrictions: No      Mobility Bed Mobility Overal bed mobility: Modified Independent Bed Mobility: Supine to Sit;Sit to Supine     Supine to sit: Supervision Sit to supine: Supervision   General bed mobility comments: weak, increased time/effort  Transfers Overall transfer level: Needs assistance Equipment used: None Transfers: Sit to/from Stand Sit to Stand: Min guard              Balance Overall balance  assessment: Needs assistance Sitting-balance support: Feet supported Sitting balance-Leahy Scale: Good     Standing balance support: No upper extremity supported Standing balance-Leahy Scale: Poor Standing balance comment: generally unsteady particularly when turning                           ADL either performed or assessed with clinical judgement   ADL Overall ADL's : Needs assistance/impaired                                       General ADL Comments: CGA for sit to stand ADL tasks 2/2 decr strength and balance, CGA for ADL transfers     Vision Baseline Vision/History: No visual deficits Patient Visual Report: Blurring of vision Vision Assessment?: No apparent visual deficits Additional Comments: Pt reports L eye blurry last night (09/03/19) but resolved since and no visual deficits at this time     Perception     Praxis      Pertinent Vitals/Pain Pain Assessment: 0-10 Pain Score: 8  Pain Location: mouth, L side worse than R Pain Descriptors / Indicators: Aching Pain Intervention(s): Limited activity within patient's tolerance;Monitored during session;Patient requesting pain meds-RN notified     Hand Dominance Right   Extremity/Trunk Assessment Upper Extremity Assessment Upper Extremity Assessment: Generalized weakness(grossly weak, bilat shoulder 3+/5, otherwise 4-/5)   Lower Extremity Assessment Lower Extremity Assessment: Generalized weakness(B LE: ankle DF/PF, knee flex/ext: 4-/5, hip flexion: 3+/5)   Cervical / Trunk Assessment Cervical / Trunk Assessment: Kyphotic   Communication Communication Communication:  No difficulties   Cognition Arousal/Alertness: Awake/alert Behavior During Therapy: WFL for tasks assessed/performed Overall Cognitive Status: Within Functional Limits for tasks assessed                                     General Comments       Exercises Other Exercises Other Exercises: Pt educated  in energy conservation and falls prevention strategies with handout provided to support recall and carryover Other Exercises: Educated in use of shower chair for safer bathing   Shoulder Instructions      Home Living Family/patient expects to be discharged to:: Private residence Living Arrangements: Alone Available Help at Discharge: Family;Friend(s);Available PRN/intermittently Type of Home: House Home Access: Stairs to enter [employer] of Steps: 1 Entrance Stairs-Rails: None Home Layout: One level     Bathroom Shower/Tub: Occupational psychologist: Standard     Home Equipment: Shower seat - built in   Additional Comments: Pt verbalizes plan to stay at sister's home after discharge: 1 story home, 1+1 step with no rails to enter, walk in shower with built in bench      Prior Functioning/Environment Level of Independence: Independent        Comments: Able to ambulate without AD, no assistance with bathing, dressing, etc.; indep with med mgt; per chart, food security concerns with poor PO intake but also with poor dentition/pain leading to decreased oral intake        OT Problem List: Decreased strength;Pain;Decreased activity tolerance;Impaired balance (sitting and/or standing);Decreased knowledge of use of DME or AE      OT Treatment/Interventions: Self-care/ADL training;Therapeutic exercise;Therapeutic activities;Neuromuscular education;Energy conservation;DME and/or AE instruction;Patient/family education;Balance training    OT Goals(Current goals can be found in the care plan section) Acute Rehab OT Goals Patient Stated Goal: To return home and work with therapy so he can be stronger. OT Goal Formulation: With patient Time For Goal Achievement: 09/18/19 Potential to Achieve Goals: Good ADL Goals Pt Will Transfer to Toilet: with modified independence(LRAD for amb) Additional ADL Goal #1: Pt will verbalize plan to implement at least 2 learned energy  conservation strategies to maximize safety/independence  OT Frequency: Min 1X/week   Barriers to D/C:            Co-evaluation              AM-PAC OT "6 Clicks" Daily Activity     Outcome Measure Help from another person eating meals?: None Help from another person taking care of personal grooming?: None Help from another person toileting, which includes using toliet, bedpan, or urinal?: A Little Help from another person bathing (including washing, rinsing, drying)?: A Little Help from another person to put on and taking off regular upper body clothing?: None Help from another person to put on and taking off regular lower body clothing?: A Little 6 Click Score: 21   End of Session Equipment Utilized During Treatment: Gait belt Nurse Communication: Patient requests pain meds  Activity Tolerance: Patient tolerated treatment well Patient left: in bed;with call bell/phone within reach;with bed alarm set  OT Visit Diagnosis: Other abnormalities of gait and mobility (R26.89);Muscle weakness (generalized) (M62.81);Pain                Time: 0912-0940 OT Time Calculation (min): 28 min Charges:  OT General Charges $OT Visit: 1 Visit OT Evaluation $OT Eval Moderate Complexity: 1 Mod OT Treatments $Self Care/Home Management :  8-22 mins  Jeni Salles, MPH, MS, OTR/L ascom [phone] 09/04/19, 10:40 AM

## 2019-09-04 NOTE — Progress Notes (Signed)
[facility] Progress Note  Patient: Eugene Strong [id number]   PCP: Patient, No Pcp Per DOB: 02-24-1951   DOA: 09/01/2019   DOS: 09/04/2019   Date of Service: the patient was seen and examined on 09/04/2019  Chief Complaint  Patient presents with  . Weakness   Brief hospital course: Pt. with PMH of CVA, smoker, weight loss; presented with complain of left-sided weakness, was found to have acute embolic CVA. Further work-up was concerning for left ICA stenosis as well as vertebral artery stenosis. Vascular surgery consulted for further assistance. Currently further plan is for the stroke work-up.  Subjective: reports throat pain. No chest pain, no shortness of breath, no fever or chills.  Has some difficulty swallowing today.  Assessment and Plan: Scheduled Meds: . aspirin EC  81 mg Oral Daily  . atorvastatin  80 mg Oral q1800  . benzocaine   Mouth/Throat QID  . clopidogrel  75 mg Oral Daily  . enoxaparin (LOVENOX) injection  30 mg Subcutaneous Q24H  . feeding supplement (ENSURE ENLIVE)  237 mL Oral TID WC  . insulin aspart  0-6 Units Subcutaneous TID WC  . magic mouthwash  15 mL Oral QID  . mouth rinse  15 mL Mouth Rinse BID  . polyethylene glycol  17 g Oral Daily  . potassium phosphate (monobasic)  500 mg Oral TID WC & HS   Continuous Infusions: PRN Meds: acetaminophen **OR** acetaminophen (TYLENOL) oral liquid 160 mg/5 mL **OR** acetaminophen, dextrose, hydrALAZINE, lidocaine, ondansetron (ZOFRAN) IV, senna-docusate, traMADol  1.  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 Out of the window For any TPA. PT OT consult recommendation home health  Speech therapy consult pending. Patient passed swallow evaluation. And his diet was advanced but today on the time of the speech therapy evaluation patient appears to have some difficulty swallowing.  Modified barium swallow was performed but no diet modification has  been recommended based on the results. Permissive hypertension. Aspirin and Plavix for 3 weeks followed by aspirin alone. LDL 85 add Lipitor 80 mg.  2.  Hypoglycemia. Type 2 diabetes mellitus based on hemoglobin A1c New onset diabetes is the patient never has seen a doctor for last 20 years. Patient actually was hypoglycemic likely from poor p.o. intake. Hemoglobin A1c currently down to 6.6. Will benefit from being on Metformin. Add sensitive sliding scale.  3.  Underweight Body mass index is 11.56 kg/m.  Nutrition Problem: Severe Malnutrition Etiology: social / environmental circumstances(inadequate oral intake, mouth pain)  At risk for refeeding syndrome.  Nutrition Interventions: Interventions: Refer to RD note for recommendations Likely from poor p.o. intake from mandibular tori.  4.  Throat pain.   Etiology not clear.  No erythema on the pharynx identified at the time of examination. Add Magic mouthwash on scheduled basis. Lidocaine mouthwash as needed.  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 CT maxillofacial suggest dense bony overgrowth from the anterior mandible bilaterally suggesting mandibular tori. Has encroachment on the floor of the mouth as well as deviates the anterior tongue as well. Likely causing poor p.o. intake and possible resultant malnutrition. We will need to arrange ENT follow-up outpatient.  6.  Flatulence. Ileus. Constipation Patient reported significant Flatulence and diarrhea X-ray abdomen shows evidence of scattered gas-filled bowel loops with mild to moderate distention. There is also stool in the rectum and scattered loops of the gas-filled colon. Repeat x ray shows improvement in the findings of ileus but  still has significant stool.  9.  Left ICA stenosis. Vertebral artery stenosis. Vascular surgery consulted. Given the complexity of the lesion as well as calcification they recommend open surgery  scheduled to be performed on outpatient basis in 10 to 14 days. They are requesting cardiac clearance prior to discharge. [facility] heart care consulted.  Diet: Regular diet  DVT Prophylaxis: Subcutaneous Lovenox   Advance goals of care discussion: Full code  Family Communication: no family was present at bedside, at the time of interview.   Disposition:  Discharge to home.  Consultants: Neurology Procedures: Echocardiogram   Antibiotics: Anti-infectives (From admission, onward)   None       Objective: Physical Exam: Vitals:   09/03/19 2233 09/04/19 0406 09/04/19 0814 09/04/19 1200  BP: (!) 123/55 (!) 131/59 (!) 127/58 (!) 122/59  Pulse: 64 70 64 72  Resp: 16 16 18 18   Temp: (!) 97.5 F (36.4 C) 97.6 F (36.4 C) (!) 97.4 F (36.3 C)   TempSrc: Oral Oral Oral   SpO2: 99% 99% 100% 100%  Weight:      Height:        Intake/Output Summary (Last 24 hours) at 09/04/2019 1827 Last data filed at 09/04/2019 1300 Gross per 24 hour  Intake 360 ml  Output --  Net 360 ml   Filed Weights   09/01/19 1028  Weight: 40.8 kg   General: Cachectic appearing gentleman, alert and oriented to time, place, and person. Appear in mild distress, affect appropriate  Eyes: PERRL, Conjunctiva normal ENT: Oral Mucosa Clear, moist lower palate has hard bony lesion Neck: no JVD, no Abnormal Mass Or lumps Cardiovascular: S1 and S2 Present, no Murmur,  Respiratory: good respiratory effort, Bilateral Air entry equal and Decreased, no signs of accessory muscle use, Clear to Auscultation, no Crackles, no wheezes Abdomen: Bowel Sound present, Soft and no tenderness, no hernia Skin: no rashes  Extremities: no Pedal edema, no calf tenderness Neurologic: mental status, alert and oriented x3, speech normal, PERLA, Motor strength reduced at right upper, right lower and Positive pronator drift on the right extremity, Sensation grossly normal to light touch, Reflex difficult to assess and Finger to nose test  normal bilaterally  Gait not checked due to patient safety concerns  Data Reviewed: I have personally reviewed and interpreted daily labs, tele strips, imagings as discussed above. I reviewed all nursing notes, pharmacy notes, vitals, pertinent old records I have discussed plan of care as described above with RN and patient/family.  CBC: Recent Labs  Lab 09/01/19 1038 09/04/19 0531  WBC 7.5 4.9  NEUTROABS 5.5  --   HGB 12.4* 8.7*  HCT 36.1* 25.3*  MCV 102.6* 103.3*  PLT 305 [id number]   Basic Metabolic Panel: Recent Labs  Lab 09/01/19 1038 09/04/19 0531  NA 140 139  K 4.1 3.7  CL 107 106  CO2 20* 25  GLUCOSE 107* 92  BUN 23 16  CREATININE 0.91 0.52*  CALCIUM 10.0 8.5*  MG  --  1.7  PHOS  --  1.7*    Liver Function Tests: Recent Labs  Lab 09/01/19 1038  AST 23  ALT 9  ALKPHOS 48  BILITOT 0.6  PROT 7.5  ALBUMIN 3.8   No results for input(s): LIPASE, AMYLASE in the last 168 hours. No results for input(s): AMMONIA in the last 168 hours. Coagulation Profile: Recent Labs  Lab 09/01/19 1038  INR 0.9   Cardiac Enzymes: No results for input(s): CKTOTAL, CKMB, CKMBINDEX, TROPONINI in the last 168 hours.  BNP (last 3 results) No results for input(s): PROBNP in the last 8760 hours. CBG: Recent Labs  Lab 09/03/19 1636 09/03/19 2135 09/04/19 0816 09/04/19 1226 09/04/19 1647  GLUCAP 96 103* 89 107* 90   Studies: DG Abd Portable 1V  Result Date: 09/04/2019 CLINICAL DATA:  68 year old male with constipation. Suspected ileus. EXAM: PORTABLE ABDOMEN - 1 VIEW COMPARISON:  Abdominal radiographs 09/02/2019. CTA neck yesterday. FINDINGS: 2 supine views of the abdomen and pelvis at 0755 hours. Excreted IV contrast in the urinary bladder. Decreased small bowel gas and non obstructed bowel gas pattern currently. Increased volume of retained stool in the right colon. Paucity of distal stool. Abdominal visceral contours are within normal limits. Probable pulmonary hyperinflation  but otherwise negative visible lung bases. Degenerative endplate spurring with disc space loss in the lumbar spine. No acute osseous abnormality identified. Calcified iliac atherosclerosis. IMPRESSION: 1. Normalized bowel gas pattern, no evidence of bowel obstruction. Increased retained stool in the right colon, but paucity of distal stool 2. Calcified atherosclerosis. Electronically Signed   By: Genevie Ann M.D.   On: 09/04/2019 08:26   Time spent: 35 minutes  Author: Berle Mull, MD [facility] 09/04/2019 6:27 PM  To reach On-call, see care teams to locate the attending and reach out to them via [email or web address]. If 7PM-7AM, please contact night-coverage If you still have difficulty reaching the attending provider, please page the [facility] (Director on Call) for [facility] on amion for assistance.

## 2019-09-04 NOTE — Progress Notes (Signed)
Physical Therapy Treatment Patient Details Name: Eugene Strong MRN: [id number] DOB: [birth year] Today's Date: 09/04/2019    History of Present Illness Pt is a 68 year old M who comes to the ED with L side weakness and slurred speech.  Imaging revealed  acute embolic left parietal occipital CVA and pt is generally weak but symptoms have mostly resolved.  [employer] includes CVA affecting L side.    PT Comments    Pt is making gradual progress towards goals, still limited by fatigue/weakness. Agreeable to ambulate in room, however doesn't feel able to sit in recliner. He did mention he had OT/PT back to back, possible explanation of fatigue. Good balance noted with ambulation and endurance with strength there-ex. Plans to dc to sisters home. Will continue to progress as able.   Follow Up Recommendations  Home health PT;Supervision for mobility/OOB     Equipment Recommendations  Rolling walker with 5" wheels    Recommendations for Other Services       Precautions / Restrictions Precautions Precautions: Fall Restrictions Weight Bearing Restrictions: No    Mobility  Bed Mobility Overal bed mobility: Modified Independent Bed Mobility: Supine to Sit;Sit to Supine     Supine to sit: Supervision Sit to supine: Supervision   General bed mobility comments: Safe technique performed, once seated, upright posture noted  Transfers Overall transfer level: Needs assistance Equipment used: None Transfers: Sit to/from Stand Sit to Stand: Min guard         General transfer comment: able to stand with ease from lower surface. Safe technique. No dizziness noted  Ambulation/Gait Ambulation/Gait assistance: Supervision Gait Distance (Feet): 40 Feet Assistive device: None Gait Pattern/deviations: Step-to pattern;Decreased step length - right;Decreased step length - left     General Gait Details: short step to gait pattern without AD. Pt fatigues quickly and requests to return back to bed  despite cues/education.   Stairs             Wheelchair Mobility    Modified Rankin (Stroke Patients Only)       Balance Overall balance assessment: Needs assistance Sitting-balance support: Feet supported Sitting balance-Leahy Scale: Good     Standing balance support: No upper extremity supported Standing balance-Leahy Scale: Fair Standing balance comment: able to stand without assist                            Cognition Arousal/Alertness: Awake/alert Behavior During Therapy: WFL for tasks assessed/performed Overall Cognitive Status: Within Functional Limits for tasks assessed                                        Exercises Other Exercises Other Exercises: Pt educated in energy conservation and falls prevention strategies with handout provided to support recall and carryover Other Exercises: Educated in use of shower chair for safer bathing Other Exercises: supine ther-ex performed including B LE AP, SLR, hip abd/add, hip add squeezes, bridging, and resisted heel slides. All ther-ex performed x 10 reps with cga. Safe technique performed. Other Exercises: Pt ambulated to bathroom with cga. Safe technique performed with transfers on/off commode.    General Comments        Pertinent Vitals/Pain Pain Assessment: No/denies pain Pain Score: 8  Pain Location: mouth, L side worse than R Pain Descriptors / Indicators: Aching Pain Intervention(s): Limited activity within patient's tolerance;Monitored during session;Patient requesting  pain meds-RN notified    Home Living Family/patient expects to be discharged to:: Private residence Living Arrangements: Alone Available Help at Discharge: Family;Friend(s);Available PRN/intermittently Type of Home: House Home Access: Stairs to enter Entrance Stairs-Rails: None Home Layout: One level Home Equipment: Shower seat - built in Additional Comments: Pt verbalizes plan to stay at sister's home  after discharge: 1 story home, 1+1 step with no rails to enter, walk in shower with built in bench    Prior Function Level of Independence: Independent      Comments: Able to ambulate without AD, no assistance with bathing, dressing, etc.; indep with med mgt; per chart, food security concerns with poor PO intake but also with poor dentition/pain leading to decreased oral intake   PT Goals (current goals can now be found in the care plan section) Acute Rehab PT Goals Patient Stated Goal: To return home and work with therapy so he can be stronger. PT Goal Formulation: With patient Time For Goal Achievement: 09/16/19 Potential to Achieve Goals: Good Progress towards PT goals: Progressing toward goals    Frequency    7X/week      PT Plan Current plan remains appropriate    Co-evaluation              AM-PAC PT "6 Clicks" Mobility   Outcome Measure  Help needed turning from your back to your side while in a flat bed without using bedrails?: None Help needed moving from lying on your back to sitting on the side of a flat bed without using bedrails?: None Help needed moving to and from a bed to a chair (including a wheelchair)?: A Little Help needed standing up from a chair using your arms (e.g., wheelchair or bedside chair)?: A Little Help needed to walk in hospital room?: A Little Help needed climbing 3-5 steps with a railing? : A Lot 6 Click Score: 19    End of Session Equipment Utilized During Treatment: Gait belt Activity Tolerance: Patient limited by fatigue Patient left: in bed;with bed alarm set Nurse Communication: Mobility status PT Visit Diagnosis: Unsteadiness on feet (R26.81);Muscle weakness (generalized) (M62.81);Difficulty in walking, not elsewhere classified (R26.2)     Time: [id number] PT Time Calculation (min) (ACUTE ONLY): 23 min  Charges:  $Gait Training: 8-22 mins $Therapeutic Exercise: 8-22 mins                     Greggory Stallion, PT,  DPT [phone]    Ellenore Roscoe 09/04/2019, 12:40 PM

## 2019-09-04 NOTE — Evaluation (Signed)
Objective Swallowing Evaluation: Type of Study: MBS-Modified Barium Swallow Study   Patient Details  Name: Eugene Strong MRN: [id number] Date of Birth: [birth year]  Today's Date: 09/04/2019 Time: SLP Start Time (ACUTE ONLY): [id number] -SLP Stop Time (ACUTE ONLY): 1406  SLP Time Calculation (min) (ACUTE ONLY): 60 min   Past Medical History:  Past Medical History:  Diagnosis Date  . Stroke [facility])    Past Surgical History: History reviewed. No pertinent surgical history. HPI: 68 y.o. male presenting with complaints of weakness.  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  Concern is for embolic etiology.  Patient on no medications prior to admission.  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  Carotid dopplers show no evidence of hemodynamically significant stenosis on the right with >70% stenosis on the left.    Patient has significant abscess on floor of mouth.  Patient is underwieght- BMI- 11.56   Subjective: Patient alert, eager for any help    Assessment / Plan / Recommendation  CHL IP CLINICAL IMPRESSIONS 09/04/2019  Clinical Impression The patient is presenting with moderate oropharyngeal dysphagia characterized by disorganized/slowed oral management, mild oral residue post swallow, delayed pharyngeal swallow initiation, reduced pharyngeal pressure generation (reduced tongue base retraction, reduced hyolaryngeal excursion, and incomplete epiglottic inversion), moderate pharyngeal residue post swallow, and trace silent aspiration of pharyngeal residue.  The patient is at risk for chronic trace aspiration of pharyngeal residue.  Diet modification is not indicated as he has residue across all consistencies.   Recommend continuing current diet (mechanical soft with thin liquids).  The patient has been advised to maintain strict oral care to reduce risk for aspiration of oral/pharyngeal bacteria.  The patient would benefit from home health  speech therapy for exercises to improve hyolaryngeal movement and reduce pharyngeal residue, such as Shaker Exercise, effortful pitch glide, and high effort/high intensity vocal exercises (such as loud vowel prolongation, loud speech).    SLP Visit Diagnosis Dysphagia, oropharyngeal phase (R13.12)  Attention and concentration deficit following --  Frontal lobe and executive function deficit following --  Impact on safety and function Severe aspiration risk;Risk for inadequate nutrition/hydration      No flowsheet data found.   Prognosis 09/04/2019  Prognosis for Safe Diet Advancement Fair  Barriers to Reach Goals Severity of deficits  Barriers/Prognosis Comment --    CHL IP DIET RECOMMENDATION 09/04/2019  SLP Diet Recommendations Dysphagia 3 (Mech soft) solids;Thin liquid  Liquid Administration via Cup;Straw  Medication Administration Whole meds with puree  Compensations --  Postural Changes --      CHL IP OTHER RECOMMENDATIONS 09/04/2019  Recommended Consults --  Oral Care Recommendations Oral care QID  Other Recommendations --      CHL IP FOLLOW UP RECOMMENDATIONS 09/04/2019  Follow up Recommendations Home health SLP      No flowsheet data found.         CHL IP ORAL PHASE 09/04/2019  Oral Phase Impaired  Oral - Pudding Teaspoon --  Oral - Pudding Cup --  Oral - Honey Teaspoon --  Oral - Honey Cup --  Oral - Nectar Teaspoon --  Oral - Nectar Cup --  Oral - Nectar Straw --  Oral - Thin Teaspoon --  Oral - Thin Cup --  Oral - Thin Straw --  Oral - Puree --  Oral - Mech Soft --  Oral - Regular --  Oral - Multi-Consistency --  Oral - Pill --  Oral  Phase - Comment Slow, disorganized posterior transfer; oral residue post swallow    CHL IP PHARYNGEAL PHASE 09/04/2019  Pharyngeal Phase Impaired  Pharyngeal- Pudding Teaspoon --  Pharyngeal --  Pharyngeal- Pudding Cup --  Pharyngeal --  Pharyngeal- Honey Teaspoon --  Pharyngeal --  Pharyngeal- Honey Cup --   Pharyngeal --  Pharyngeal- Nectar Teaspoon --  Pharyngeal --  Pharyngeal- Nectar Cup --  Pharyngeal --  Pharyngeal- Nectar Straw --  Pharyngeal --  Pharyngeal- Thin Teaspoon --  Pharyngeal --  Pharyngeal- Thin Cup --  Pharyngeal --  Pharyngeal- Thin Straw --  Pharyngeal --  Pharyngeal- Puree --  Pharyngeal --  Pharyngeal- Mechanical Soft --  Pharyngeal --  Pharyngeal- Regular --  Pharyngeal --  Pharyngeal- Multi-consistency --  Pharyngeal --  Pharyngeal- Pill --  Pharyngeal --  Pharyngeal Comment Reduced hyolaryngeal excursion, reduced tongue base retraction, incomplete epiglottic inversion     No flowsheet data found.  Leroy Sea, MS/CCC- SLP  Lou Miner 09/04/2019, 2:07 PM

## 2019-09-04 NOTE — Evaluation (Signed)
Clinical/Bedside Swallow Evaluation Patient Details  Name: Eugene Strong MRN: [id number] Date of Birth: [birth year]  Today's Date: 09/04/2019 Time: SLP Start Time (ACUTE ONLY): 1215 SLP Stop Time (ACUTE ONLY): 1314 SLP Time Calculation (min) (ACUTE ONLY): 59 min  Past Medical History:  Past Medical History:  Diagnosis Date  . Stroke [facility])    Past Surgical History: History reviewed. No pertinent surgical history. HPI:     Eugene Strong is a 68 y.o. male with medical history significant of stroke, former smoker, not seeing doctors for 20 years, who presents with left-sided weakness. MRI revealed small acute infarcts in the left parietal and occipital lobes.  Assessment / Plan / Recommendation Clinical Impression  Pt visited for cognitive screen and noted to have some difficulty managing his secretion. Obtained order for bedside swallow eval. Pt presents with oral motor strength and ROM adequate for speech and swallowing. Noted poor dentition with most teeth missing. Pt reports he has lost a lot of weight because he has no teeth and it hurts to eat. Oral mech exam also revealed a protrusion on the lower gum line area. Pt reports he has had trouble swallowing over the last week and has extra phlegm in his mouth. Today, he demonstrated a very weak cough after 1 of 5 sips of thin liquid. Cough was very weak and not protective of airway even with max cueing. Pt tolerated the nectar thick liquids well but refused after the first sip stating he did not like it. Noted multiple swallows per bolus with every sip and bite likely secondary to pharyngeal residue. Pt also turned his head to the left when he swallowed seemingly compensating for any pharyngeal dysphagia. Rec mod ba swallow to further asses pharyngeal phase of swallow. Possible discharge home in the next 1-2 days. May need ST at discharge. Will determine diet rec and needs after Mod ba swallow today. SLP Visit Diagnosis: Dysphagia, oropharyngeal  phase (R13.12)    Aspiration Risk  Moderate aspiration risk    Diet Recommendation Dysphagia 3 (Mech soft);Other (Comment)(Diet rec after MBSS)        Other  Recommendations     Follow up Recommendations        Frequency and Duration            Prognosis        Swallow Study   General Date of Onset: 09/01/19 Type of Study: Bedside Swallow Evaluation Diet Prior to this Study: Dysphagia 3 (soft) Temperature Spikes Noted: No Respiratory Status: Room air Behavior/Cognition: Alert;Cooperative;Pleasant mood Oral Cavity Assessment: Lesions;Other (comment)(Noted hard protrusion/bump on lower gum area.) Oral Cavity - Dentition: Poor condition;Missing dentition;Other (Comment)(Mouth sore, very few teeth) Vision: Functional for self-feeding Self-Feeding Abilities: Able to feed self Patient Positioning: Upright in bed Baseline Vocal Quality: Wet Volitional Cough: Weak;Other (Comment)(Barely able to eleicit)    Oral/Motor/Sensory Function Overall Oral Motor/Sensory Function: Within functional limits   Ice Chips     Thin Liquid Thin Liquid: Impaired Presentation: Cup;Spoon;Straw Pharyngeal  Phase Impairments: Multiple swallows;Throat Clearing - Immediate;Cough - Immediate    Nectar Thick Nectar Thick Liquid: Within functional limits Presentation: Cup   Honey Thick Honey Thick Liquid: Not tested Presentation: Self fed   Puree Puree: Within functional limits   Solid     Solid: Impaired Presentation: Self Fed Oral Phase Impairments: Impaired mastication Oral Phase Functional Implications: Prolonged oral transit;Impaired mastication;Other (comment)(Poor dentition)      Eugene Strong 09/04/2019,1:15 PM

## 2019-09-05 DIAGNOSIS — Z01810 Encounter for preprocedural cardiovascular examination: Secondary | ICD-10-CM

## 2019-09-05 DIAGNOSIS — I6389 Other cerebral infarction: Secondary | ICD-10-CM

## 2019-09-05 LAB — CBC
HCT: 24.9 % — ABNORMAL LOW (ref 39.0–52.0)
Hemoglobin: 8.7 g/dL — ABNORMAL LOW (ref 13.0–17.0)
MCH: 35.4 pg — ABNORMAL HIGH (ref 26.0–34.0)
MCHC: 34.9 g/dL (ref 30.0–36.0)
MCV: 101.2 fL — ABNORMAL HIGH (ref 80.0–100.0)
Platelets: 198 10*3/uL (ref 150–400)
RBC: 2.46 MIL/uL — ABNORMAL LOW (ref 4.22–5.81)
RDW: 17.9 % — ABNORMAL HIGH (ref 11.5–15.5)
WBC: 4 10*3/uL (ref 4.0–10.5)
nRBC: 0 % (ref 0.0–0.2)

## 2019-09-05 LAB — PHOSPHORUS: Phosphorus: 1.8 mg/dL — ABNORMAL LOW (ref 2.5–4.6)

## 2019-09-05 LAB — BASIC METABOLIC PANEL
Anion gap: 7 (ref 5–15)
BUN: 10 mg/dL (ref 8–23)
CO2: 28 mmol/L (ref 22–32)
Calcium: 8.5 mg/dL — ABNORMAL LOW (ref 8.9–10.3)
Chloride: 101 mmol/L (ref 98–111)
Creatinine, Ser: 0.48 mg/dL — ABNORMAL LOW (ref 0.61–1.24)
GFR calc Af Amer: >60 mL/min (ref >60)
GFR calc non Af Amer: >60 mL/min (ref >60)
Glucose, Bld: 89 mg/dL (ref 70–99)
Potassium: 3.9 mmol/L (ref 3.5–5.1)
Sodium: 136 mmol/L (ref 135–145)

## 2019-09-05 LAB — MAGNESIUM: Magnesium: 2 mg/dL (ref 1.7–2.4)

## 2019-09-05 LAB — GLUCOSE, CAPILLARY
Glucose-Capillary: 93 mg/dL (ref 70–99)
Glucose-Capillary: 112 mg/dL — ABNORMAL HIGH (ref 70–99)

## 2019-09-05 LAB — TSH: TSH: 2.512 u[IU]/mL (ref 0.350–4.500)

## 2019-09-05 MED ORDER — ATORVASTATIN CALCIUM 80 MG PO TABS: 80.0000 mg | ORAL_TABLET | Freq: Every day | ORAL | 0 refills | Status: AC

## 2019-09-05 MED ORDER — MAGIC MOUTHWASH: 15.0000 mL | Freq: Four times a day (QID) | ORAL | 0 refills | Status: DC

## 2019-09-05 MED ORDER — CLOPIDOGREL BISULFATE 75 MG PO TABS: 75.0000 mg | ORAL_TABLET | Freq: Every day | ORAL | 0 refills | Status: AC

## 2019-09-05 MED ORDER — BENZOCAINE 10 % MT GEL: Freq: Four times a day (QID) | OROMUCOSAL | 0 refills | Status: DC

## 2019-09-05 MED ORDER — ADULT MULTIVITAMIN W/MINERALS CH: 1.0000 | ORAL_TABLET | Freq: Every day | ORAL | 0 refills | Status: AC

## 2019-09-05 MED ORDER — METFORMIN HCL 500 MG PO TABS: 500.0000 mg | ORAL_TABLET | Freq: Two times a day (BID) | ORAL | 0 refills | Status: AC

## 2019-09-05 MED ORDER — ENSURE ENLIVE PO LIQD: 237.0000 mL | Freq: Three times a day (TID) | ORAL | 12 refills | Status: AC

## 2019-09-05 MED ORDER — ADULT MULTIVITAMIN W/MINERALS CH
1.0000 | ORAL_TABLET | Freq: Every day | ORAL | Status: DC
  Administered 2019-09-05: 10:00:00 1 via ORAL
  Filled 2019-09-05: qty 1

## 2019-09-05 MED ORDER — ASPIRIN 81 MG PO TBEC: 81.0000 mg | DELAYED_RELEASE_TABLET | Freq: Every day | ORAL | 0 refills | Status: AC

## 2019-09-05 MED ORDER — POLYETHYLENE GLYCOL 3350 17 G PO PACK: 17.0000 g | PACK | Freq: Every day | ORAL | 0 refills | Status: AC

## 2019-09-05 MED ORDER — SODIUM PHOSPHATES 45 MMOLE/15ML IV SOLN
10.0000 mmol | Freq: Once | INTRAVENOUS | Status: AC
  Administered 2019-09-05: 10 mmol via INTRAVENOUS
  Filled 2019-09-05: qty 3.33

## 2019-09-05 NOTE — Progress Notes (Signed)
Physical Therapy Treatment Patient Details Name: Eugene Strong MRN: [id number] DOB: 1951-05-12 Today's Date: 09/05/2019    History of Present Illness Pt is a 68 year old M who comes to the ED with L side weakness and slurred speech.  Imaging revealed  acute embolic left parietal occipital CVA and pt is generally weak but symptoms have mostly resolved.  [place] includes CVA affecting L side.    PT Comments    Feeling better today, excited to possibly be discharged today.  Able to don sweat pants with no assist and good sitting balance.  Stood with supervision to walker and was able to progress gait 90' in hallway with RW and min guard.  Limited by fatigue but RW allowed for increased distance and improved safety.  Safety deficits remain as he sits quickly upon return to bed without reaching back.  Education provided.  Encouraged +1 assist with sister upon return home.   Follow Up Recommendations  Home health PT;Supervision for mobility/OOB     Equipment Recommendations  Rolling walker with 5" wheels    Recommendations for Other Services       Precautions / Restrictions Precautions Precautions: Fall Restrictions Weight Bearing Restrictions: No    Mobility  Bed Mobility Overal bed mobility: Modified Independent Bed Mobility: Supine to Sit;Sit to Supine     Supine to sit: Modified independent (Device/Increase time) Sit to supine: Modified independent (Device/Increase time)      Transfers Overall transfer level: Needs assistance Equipment used: Rolling walker (2 wheeled) Transfers: Sit to/from Stand Sit to Stand: Min guard            Ambulation/Gait Ambulation/Gait assistance: Supervision;Min guard Gait Distance (Feet): 90 Feet Assistive device: Rolling walker (2 wheeled) Gait Pattern/deviations: Step-through pattern;Trunk flexed;Narrow base of support Gait velocity: decreased   General Gait Details: able to progress gait with RW into hallway with improved  confidence and distance.  remains limited by fatigue.   Stairs             Wheelchair Mobility    Modified Rankin (Stroke Patients Only)       Balance Overall balance assessment: Needs assistance Sitting-balance support: Feet supported Sitting balance-Leahy Scale: Good     Standing balance support: No upper extremity supported Standing balance-Leahy Scale: Fair Standing balance comment: able to stand without assist                            Cognition Arousal/Alertness: Awake/alert Behavior During Therapy: WFL for tasks assessed/performed Overall Cognitive Status: Within Functional Limits for tasks assessed                                        Exercises      General Comments        Pertinent Vitals/Pain Pain Assessment: No/denies pain    Home Living                      Prior Function            PT Goals (current goals can now be found in the care plan section) Progress towards PT goals: Progressing toward goals    Frequency    7X/week      PT Plan Current plan remains appropriate    Co-evaluation  AM-PAC PT "6 Clicks" Mobility   Outcome Measure  Help needed turning from your back to your side while in a flat bed without using bedrails?: None Help needed moving from lying on your back to sitting on the side of a flat bed without using bedrails?: None Help needed moving to and from a bed to a chair (including a wheelchair)?: None Help needed standing up from a chair using your arms (e.g., wheelchair or bedside chair)?: None Help needed to walk in hospital room?: A Little Help needed climbing 3-5 steps with a railing? : A Little 6 Click Score: 22    End of Session Equipment Utilized During Treatment: Gait belt Activity Tolerance: Patient limited by fatigue Patient left: in bed;with bed alarm set;with call bell/phone within reach Nurse Communication: Mobility status       Time:  [id number] PT Time Calculation (min) (ACUTE ONLY): 8 min  Charges:  $Gait Training: 8-22 mins                    Chesley Noon, PTA 09/05/19, 9:17 AM

## 2019-09-05 NOTE — Progress Notes (Signed)
Pt being discharged, discharge instructions and prescriptions reviewed with pt and sister, states understanding, pt with no complaints at discharge

## 2019-09-05 NOTE — Progress Notes (Signed)
[facility]  Daily Progress Note   Subjective  -   No major events overnight Using right hand to feed himself and swallowing OK this am Complains of teeth pain  Some speech difficulties remain but improving  Objective Vitals:   09/04/19 1958 09/05/19 0121 09/05/19 0428 09/05/19 0757  BP: (!) 121/58 (!) 133/56 (!) 119/57 (!) 119/57  Pulse: 65 71 75 64  Resp: 18 18 16 18   Temp: 97.6 F (36.4 C) 97.8 F (36.6 C) 98.3 F (36.8 C) 98.5 F (36.9 C)  TempSrc:   Oral   SpO2: 100% 99% 99% 100%  Weight:      Height:        Intake/Output Summary (Last 24 hours) at 09/05/2019 0803 Last data filed at 09/04/2019 1700 Gross per 24 hour  Intake 600 ml  Output -  Net 600 ml    PULM  CTAB CV  RRR   Laboratory CBC    Component Value Date/Time   WBC 4.0 09/05/2019 0511   HGB 8.7 (L) 09/05/2019 0511   HCT 24.9 (L) 09/05/2019 0511   PLT 198 09/05/2019 0511    BMET    Component Value Date/Time   NA 136 09/05/2019 0511   K 3.9 09/05/2019 0511   CL 101 09/05/2019 0511   CO2 28 09/05/2019 0511   GLUCOSE 89 09/05/2019 0511   BUN 10 09/05/2019 0511   CREATININE 0.48 (L) 09/05/2019 0511   CALCIUM 8.5 (L) 09/05/2019 0511   GFRNONAA >60 09/05/2019 0511   GFRAA >60 09/05/2019 0511    Assessment/Planning:    Left ICA stenosis, high grade with left hemispheric stroke  Allowing 10-14 days after stroke to avoid reperfusion injury before revascularization  Anatomy poor for stenting.  Prefer CEA in his case.  Cardiac evaluation planned with consult placed for today  OK to discharge home on statin/ASA/Plavix from my POV and plan left CEA as an outpatient if cardiac evaluation turns out ok      Leotis Pain  09/05/2019, 8:03 AM

## 2019-09-05 NOTE — Consult Note (Signed)
Cardiology Consultation:   Patient ID: Eugene Strong; [id number]; 1951/03/31   Admit date: 09/01/2019 Date of Consult: 09/05/2019  Primary Care Provider: Patient, No Pcp Per Primary Cardiologist: new to [facility] - consult by Arida   Patient Profile:   Eugene Strong is a 68 y.o. male with a hx of recurrent CVA, ongoing tobacco abuse, prior marijuana and alcohol use, and recently diagnosed diabetes this admission who is being seen today for preprocedural cardiac evaluation at the request of Dr. Lucky Cowboy.  History of Present Illness:   Eugene Strong has no previously known cardiac history. He has not seen a provider in > 20 years as an outpatient. He indicates he had his first stroke on 08/04/2019 while at [facility], though did not seek medical care for this. He was admitted to [facility] on 09/01/2019 with an acute left hemispheric CVA. CT head showed age uncertain, possible recent acute infarct in the mid right frontal lobe at the gray-white junction, older appearing infarct involving a portion of the head of the caudate nucleus on the right in the immediately adjacent anterior limb of the right external capsule without mass or hemorrhage, arterial vascular calcification. MRI brain showed a few tiny subcentimeter acute to early subacute cortical infarcts involving the left paretial and occipital lobes. MRA head showed scattered intracranial atherosclerotic disease as detailed below. Echo this admission showed shown an EF of 60-65%, Gr2DD, no RWMA, normal RVSF and ventricular cavity size, mild TR, normal PASP. Carotid artery ultrasound showed significant LICA stenosis estimated at > 70% with < 50% RICA stenosis. CTA neck showed critical LICA stenosis with [id number] proximal RICA stenosis. He has been evaluated by vascular surgery with plans for future revascularization to avoid reperfusion injury. He has been placed on ASA and Plavix. He continues to have some speech difficulty.   From a cardiac perspective he  denies any chest pain. He does note positional dizziness and SOB that dates back to 08/04/2019. He has been chest pain free this admission. He is a current smoker at 0.5 pack per day since age 63. He previously drank a moderate amount of EtOH though has not done so in 12 months. He has not smoked marijuana in 4 years. He continues to note some right sided weakness following his CVA, along with some generalized weakness. He has passed his swallow study. At baseline, he has been able to achieve > 4 METs with some SOB.    Past Medical History:  Diagnosis Date  . Stroke [facility])     History reviewed. No pertinent surgical history.   Home Meds: Prior to Admission medications   Not on File    Inpatient Medications: Scheduled Meds: . aspirin EC  81 mg Oral Daily  . atorvastatin  80 mg Oral q1800  . benzocaine   Mouth/Throat QID  . clopidogrel  75 mg Oral Daily  . enoxaparin (LOVENOX) injection  30 mg Subcutaneous Q24H  . feeding supplement (ENSURE ENLIVE)  237 mL Oral TID WC  . insulin aspart  0-6 Units Subcutaneous TID WC  . magic mouthwash  15 mL Oral QID  . mouth rinse  15 mL Mouth Rinse BID  . multivitamin with minerals  1 tablet Oral Daily  . polyethylene glycol  17 g Oral Daily   Continuous Infusions: . sodium phosphate  Dextrose 5% IVPB     PRN Meds: acetaminophen **OR** acetaminophen (TYLENOL) oral liquid 160 mg/5 mL **OR** acetaminophen, dextrose, hydrALAZINE, lidocaine, ondansetron (ZOFRAN) IV, senna-docusate, traMADol  Allergies:  Allergies  Allergen Reactions  . Codeine   . Penicillins     Social History:   Social History   Socioeconomic History  . Marital status: Single    Spouse name: Not on file  . Number of children: Not on file  . Years of education: Not on file  . Highest education level: Not on file  Occupational History  . Not on file  Tobacco Use  . Smoking status: Former Research scientist (life sciences)  . Smokeless tobacco: Never Used  Substance and Sexual Activity  .  Alcohol use: Not Currently  . Drug use: Never  . Sexual activity: Not on file  Other Topics Concern  . Not on file  Social History Narrative  . Not on file   Social Determinants of Health   Financial Resource Strain:   . Difficulty of Paying Living Expenses: Not on file  Food Insecurity:   . Worried About Charity fundraiser in the Last Year: Not on file  . Ran Out of Food in the Last Year: Not on file  Transportation Needs:   . Lack of Transportation (Medical): Not on file  . Lack of Transportation (Non-Medical): Not on file  Physical Activity:   . Days of Exercise per Week: Not on file  . Minutes of Exercise per Session: Not on file  Stress:   . Feeling of Stress : Not on file  Social Connections:   . Frequency of Communication with Friends and Family: Not on file  . Frequency of Social Gatherings with Friends and Family: Not on file  . Attends Religious Services: Not on file  . Active Member of Clubs or Organizations: Not on file  . Attends Archivist Meetings: Not on file  . Marital Status: Not on file  Intimate Partner Violence:   . Fear of Current or Ex-Partner: Not on file  . Emotionally Abused: Not on file  . Physically Abused: Not on file  . Sexually Abused: Not on file     Family History:   Family History  Problem Relation Age of Onset  . Goiter Father     ROS:  Review of Systems  Constitutional: Positive for malaise/fatigue. Negative for chills, diaphoresis, fever and weight loss.  HENT: Negative for congestion.   Eyes: Negative for discharge and redness.  Respiratory: Positive for shortness of breath. Negative for cough, hemoptysis, sputum production and wheezing.   Cardiovascular: Negative for chest pain, palpitations, orthopnea, claudication, leg swelling and PND.  Gastrointestinal: Negative for abdominal pain, blood in stool, heartburn, melena, nausea and vomiting.  Genitourinary: Negative for hematuria.  Musculoskeletal: Negative for falls  and myalgias.  Skin: Negative for rash.  Neurological: Positive for dizziness, sensory change, focal weakness and weakness. Negative for tingling, tremors, speech change and loss of consciousness.  Endo/Heme/Allergies: Does not bruise/bleed easily.  Psychiatric/Behavioral: Negative for substance abuse. The patient is not nervous/anxious.   All other systems reviewed and are negative.     Physical Exam/Data:   Vitals:   09/04/19 1958 09/05/19 0121 09/05/19 0428 09/05/19 0757  BP: (!) 121/58 (!) 133/56 (!) 119/57 (!) 119/57  Pulse: 65 71 75 64  Resp: 18 18 16 18   Temp: 97.6 F (36.4 C) 97.8 F (36.6 C) 98.3 F (36.8 C) 98.5 F (36.9 C)  TempSrc:   Oral   SpO2: 100% 99% 99% 100%  Weight:      Height:        Intake/Output Summary (Last 24 hours) at 09/05/2019 [id number] Last data filed  at 09/04/2019 1700 Gross per 24 hour  Intake 600 ml  Output --  Net 600 ml   Filed Weights   09/01/19 1028  Weight: 40.8 kg   Body mass index is 11.56 kg/m.   Physical Exam: General: Frail appearing, in no acute distress. Appears older than stated age.  Head: Normocephalic, atraumatic, sclera non-icteric, no xanthomas, nares without discharge.  Neck: Negative for carotid bruits. JVD not elevated. Lungs: Clear bilaterally to auscultation without wheezes, rales, or rhonchi. Breathing is unlabored. Heart: RRR with S1 S2. No murmurs, rubs, or gallops appreciated. Abdomen: Soft, non-tender, non-distended with normoactive bowel sounds. No hepatomegaly. No rebound/guarding. No obvious abdominal masses. Msk:  Strength and tone appear normal for age. Extremities: No clubbing or cyanosis. No edema. Distal pedal pulses are 2+ and equal bilaterally. Neuro: Alert and oriented X 3. No facial asymmetry. No focal deficit. Moves all extremities spontaneously. Psych:  Responds to questions appropriately with a normal affect.   EKG:  The EKG was personally reviewed and demonstrates: NSR, 60 bpm, baseline  wandering and artifact, cannot exclude prior anterior infarct, no acute st/t changes  Telemetry:  Telemetry was personally reviewed and demonstrates: SR with frequent PVCs  Weights: Filed Weights   09/01/19 1028  Weight: 40.8 kg    Relevant CV Studies:  2D Echo 09/02/2019: 1. Left ventricular ejection fraction, by visual estimation, is 60 to 65%. The left ventricle has normal function. Left ventricular septal wall thickness was normal. Normal left ventricular posterior wall thickness. There is no left ventricular  hypertrophy.  2. Left ventricular diastolic parameters are consistent with Grade II diastolic dysfunction (pseudonormalization).  3. The left ventricle has no regional wall motion abnormalities.  4. Global right ventricle has normal systolic function.The right ventricular size is normal. No increase in right ventricular wall thickness.  5. Left atrial size was normal.  6. Right atrial size was normal.  7. The mitral valve is normal in structure. No evidence of mitral valve regurgitation. No evidence of mitral stenosis.  8. The tricuspid valve is normal in structure. Tricuspid valve regurgitation is mild.  9. The aortic valve is normal in structure. Aortic valve regurgitation is not visualized. No evidence of aortic valve sclerosis or stenosis. 10. The pulmonic valve was normal in structure. Pulmonic valve regurgitation is not visualized. 11. Normal pulmonary artery systolic pressure. 12. The inferior vena cava is normal in size with greater than 50% respiratory variability, suggesting right atrial pressure of 3 mmHg.  Laboratory Data:  Chemistry Recent Labs  Lab 09/01/19 1038 09/04/19 0531 09/05/19 0511  NA 140 139 136  K 4.1 3.7 3.9  CL 107 106 101  CO2 20* 25 28  GLUCOSE 107* 92 89  BUN 23 16 10   CREATININE 0.91 0.52* 0.48*  CALCIUM 10.0 8.5* 8.5*  GFRNONAA >60 >60 >60  GFRAA >60 >60 >60  ANIONGAP 13 8 7     Recent Labs  Lab 09/01/19 1038  PROT 7.5    ALBUMIN 3.8  AST 23  ALT 9  ALKPHOS 48  BILITOT 0.6   Hematology Recent Labs  Lab 09/01/19 1038 09/04/19 0531 09/05/19 0511  WBC 7.5 4.9 4.0  RBC 3.52* 2.45* 2.46*  HGB 12.4* 8.7* 8.7*  HCT 36.1* 25.3* 24.9*  MCV 102.6* 103.3* 101.2*  MCH 35.2* 35.5* 35.4*  MCHC 34.3 34.4 34.9  RDW 18.6* 18.3* 17.9*  PLT 305 210 198   Cardiac EnzymesNo results for input(s): TROPONINI in the last 168 hours. No results for input(s): TROPIPOC in  the last 168 hours.  BNPNo results for input(s): BNP, PROBNP in the last 168 hours.  DDimer No results for input(s): DDIMER in the last 168 hours.  Radiology/Studies:  CT HEAD WO CONTRAST  Result Date: 09/01/2019 IMPRESSION: 1. Atrophy with periventricular small vessel disease. Age uncertain but potentially recent/acute infarct in the mid right frontal lobe at the gray-white junction. Older appearing infarct involving a portion of the head of the caudate nucleus on the right in the immediately adjacent anterior limb of the right external capsule. No mass or hemorrhage. 2.  There are foci of arterial vascular calcification. 3. Mucosal thickening in several ethmoid air cells. Deviated nasal septum. Electronically Signed   By: Lowella Grip III M.D.   On: 09/01/2019 11:37   CT ANGIO NECK W OR WO CONTRAST  Result Date: 09/03/2019 IMPRESSION: 1. Critical proximal left ICA stenosis with short segment string sign. 2. 45% proximal right ICA atheromatous stenosis. 3. High-grade narrowing at both vertebral origins with occluded V4 segment on the non dominant right side. 4. 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 Electronically Signed   By: Monte Fantasia M.D.   On: 09/03/2019 12:39   MR ANGIO HEAD WO CONTRAST  Result Date: 09/01/2019 IMPRESSION: 1. Few tiny subcentimeter acute to early subacute cortical infarcts involving the left parietal and occipital lobes as above. No associated hemorrhage. 2. Underlying age-related cerebral atrophy  with moderate chronic microvascular ischemic disease. 3. 8 mm nodule involving the skin of the left nasal bridge, indeterminate. Correlation with physical exam recommended. MRA HEAD IMPRESSION: 1. No large vessel occlusion. 2. 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 No other proximal high-grade or correctable stenosis. Electronically Signed   By: Jeannine Boga M.D.   On: 09/01/2019 22:01   MR BRAIN WO CONTRAST  Result Date: 09/01/2019 IMPRESSION: 1. Few tiny subcentimeter acute to early subacute cortical infarcts involving the left parietal and occipital lobes as above. No associated hemorrhage. 2. Underlying age-related cerebral atrophy with moderate chronic microvascular ischemic disease. 3. 8 mm nodule involving the skin of the left nasal bridge, indeterminate. Correlation with physical exam recommended. MRA HEAD IMPRESSION: 1. No large vessel occlusion. 2. 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 No other proximal high-grade or correctable stenosis. Electronically Signed   By: Jeannine Boga M.D.   On: 09/01/2019 22:01   US Carotid Bilateral (at [facility] and [facility] only)  Result Date: 09/02/2019 IMPRESSION: 1. Significant estimated greater than 70% proximal left ICA stenosis. 2. Estimated less than 50% proximal right ICA stenosis. Electronically Signed   By: Aletta Edouard M.D.   On: 09/02/2019 13:40   DG Chest Portable 1 View  Result Date: 09/01/2019 IMPRESSION: Hyperexpansion without acute cardiopulmonary disease. Electronically Signed   By: Marin Olp M.D.   On: 09/01/2019 11:37   DG Abd Portable 1V  Result Date: 09/04/2019 IMPRESSION: 1. Normalized bowel gas pattern, no evidence of bowel obstruction. Increased retained stool in the right colon, but paucity of distal stool 2. Calcified atherosclerosis. Electronically Signed   By: Genevie Ann M.D.   On:  09/04/2019 08:26   DG Abd Portable 1V  Result Date: 09/02/2019 IMPRESSION: Findings most suggestive of global ileus. Early partial small bowel obstruction might have a similar appearance, consider continued follow-up for further assessment. Electronically Signed   By: Zetta Bills M.D.   On: 09/02/2019 10:34    CT Maxillofacial Wo Contrast  Result Date: 09/01/2019 IMPRESSION: 1. No acute facial bone fracture. 2. Remaining mandibular dentition with small periapical lucencies. No cortical destruction or periostitis. No focal soft tissue swelling or fluid collection. 3. 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 Right-sided lesion measures up to 2.5 cm with encroachment on the floor of mouth and superior deviation of the tongue. Electronically Signed   By: Davina Poke M.D.   On: 09/01/2019 11:50    Assessment and Plan:   1. Left hemispheric CVA with severe LICA stenosis: -Vascular surgery is planning for revascularization in ~ 10-14 days to avoid reperfusion injury -ASA/Plavix -Lipitor 80 mg  -Follow up with neurology as directed   2. Preprocedure cardiac evaluation: -He has been chest pain free, though does note some exertional SOB that he dates back to 08/04/2019 -Echo this admission demonstrated preserved LVSF -He is able to achieve > 4 METs with exertional SOB and dizziness -Will discuss possible Leane Call with MD prior to undergoing vascular procedure   3. HLD: -LLD of 85 this admisison -Continue Lipitor  -Goal LDL < 70  4. Diabetes: -This is a new diagnosis  -A1c 6.6 this admission -Per IM -Outpatient follow up with PCP  5. PVCs: -BP precludes addition of beta blocker at this time -Echo as above -Possible Lexiscan as above -Magnesium and potassium at goal -Check TSH    For questions or updates, please contact [facility] Please consult [email or web address]  for contact info under Cardiology/STEMI.   Signed, Christell Faith, PA-C [place] Pager: [phone] 09/05/2019, 8:41 AM

## 2019-09-05 NOTE — TOC Transition Note (Signed)
Transition of Care [facility]) - CM/SW Discharge Note   Patient Details  Name: Eugene Strong MRN: [id number] Date of Birth: 06/21/1951  Transition of Care [facility]) CM/SW Contact:  Shelbie Hutching, RN Phone Number: 09/05/2019, 2:04 PM   Clinical Narrative:    Patient is medically ready for discharge today.  Patient will discharge to his sisters home at [address] [place].  Rolling walker taken to the patient's room by [facility] with [facility].  Patient's sister, Vivien Rota, reports that they do not need a 3 in 1.   [facility] was unable to accept patient for home health.  Referral for home health given to [facility] with [place] and accepted for PT, OT, and speech.  Tommi Rumps is aware of patient discharge today.  Patient does not have a PCP listed and reports that it has been 18 years since he went to the doctor.  RNCM scheduled new provider appointment with [facility] NP Timoteo Ace on January 8th at 10 am.  Patient and sister made aware of appointment.   Patient's sister will provide transportation home this evening.    Final next level of care: [place] Barriers to Discharge: Barriers Resolved   Patient Goals and CMS Choice Patient states their goals for this hospitalization and ongoing recovery are:: to get home CMS Medicare.gov Compare Post Acute Care list provided to:: Patient Choice offered to / list presented to : Patient  Discharge Placement                       Discharge Plan and Services   Discharge Planning Services: CM Consult Post Acute Care Choice: Home Health          DME Arranged: Walker rolling DME Agency: [facility] Date DME Agency Contacted: 09/05/19 Time DME Agency Contacted: [id number] Representative spoke with at DME Agency: [place]: PT, OT, Speech Therapy [place] Agency: [place] Date [place]: 09/05/19 Time [place]: 1200 Representative spoke with at [place]: [place] ([place]) Interventions     Readmission Risk Interventions No flowsheet data found.

## 2019-09-06 ENCOUNTER — Telehealth (INDEPENDENT_AMBULATORY_CARE_PROVIDER_SITE_OTHER): Payer: Self-pay

## 2019-09-06 NOTE — Telephone Encounter (Signed)
I attempted to contact the patient regarding his surgery scheduled with Dr. Lucky Cowboy on 09/21/19. Patient has a voicemail box that is not activated so I was unable to leave a message. Patient will do a phone pre-op on 09/12/2019 between 8-1 and his covid testing will be on 09/19/19 between 12:30-2:30 pm at the [place]. Pre-surgical instructions and information will be mailed to the patient.

## 2019-09-07 NOTE — Discharge Summary (Signed)
[facility] Discharge Summary   Patient: Eugene Strong [id number]   PCP: Patient, No Pcp Per DOB: 1951-06-11   Date of admission: 09/01/2019   Date of discharge: 09/05/2019     Discharge Diagnoses:  Principal Problem:   Stroke [facility]) Active Problems:   Hypoglycemia   HTN (hypertension)   Protein-calorie malnutrition, moderate ([place])   Protein-calorie malnutrition, severe   Admitted From: Home Disposition:  Home With home health  Recommendations for Outpatient Follow-up:  1. PCP: Follow-up with PCP in 1 week. 2. Patient will require dental referral to take care of mandibular tori. 3. I am sure that the patient follows up with neurology. 4. Follow up LABS/TEST: None  Follow-up Information    PCP. Schedule an appointment as soon as possible for a visit in 1 week(s).   Why: need dental referral       Anabel Bene, MD. Schedule an appointment as soon as possible for a visit in 2 month(s).   Specialty: Neurology Contact information: [place] [facility] [place]  [address] [phone]          Diet recommendation: Regular diet dysphagia diet  Activity: The patient is advised to gradually reintroduce usual activities,as tolerated  Discharge Condition: good  Code Status: Full code   History of present illness: As per the H and P dictated on admission, "Eugene Strong is a 68 y.o. male with medical history significant of stroke, former smoker, not seeing doctors for 20 years, who presents with left-sided weakness.  Pt states that he may have had another stroke 1 week ago, decreased as left sided weakness, and fall without significant injury. Did not seek medical attention. No headache or neck pain.  No vision loss or hearing loss. He states he also had an episode over 1 month ago where he initially thought he had a stroke where he became weak on the left side, and since then has had some slurred speech.  Denies chest pain,  shortness of breath, cough,.  No nausea vomiting, diarrhea, abdominal pain, symptoms of UTI. States has been having some sores and swelling in his mouth causing him to have decreased oral intake. Pt was found to have hypoglycemia with blood sugar 30s, which improved to 90 -110 after giving D50.  ED Course: pt was found to have WBC 7.5, INR 0.9, PTT 32, pending COVID-19 test, alcohol less than 10, electrolytes renal function okay, blood pressure 182/73, heart rate 62, oxygen saturation 95% on room air.  Chest x-ray has no infiltration.  Patient is admitted to [facility] bed as inpatient.  Message sent to Dr. Doy Mince of neuro for consultation."  Hospital Course:  Summary of his active problems in the hospital is as following. 1.  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 Out of the window For any TPA. PT OT consult recommendation home health  Speech therapy consult pending. Patient passed swallow evaluation. And his diet was advanced but today on the time of the speech therapy evaluation patient appears to have some difficulty swallowing.  Modified barium swallow was performed but no diet modification has been recommended based on the results. Permissive hypertension. Aspirin and Plavix for 3 weeks followed by aspirin alone. LDL 85 add Lipitor 80 mg.  2.  Hypoglycemia. Type 2 diabetes mellitus based on hemoglobin A1c New onset diabetes is the patient never has seen a doctor for last 20 years. Patient actually was hypoglycemic likely from poor p.o. intake. Hemoglobin A1c  currently down to 6.6. Will benefit from being on Metformin.  3.  Underweight Body mass index is 11.56 kg/m.  Nutrition Problem: Severe Malnutrition Etiology: social / environmental circumstances(inadequate oral intake, mouth pain)  At risk for refeeding syndrome.  Nutrition Interventions: Interventions: Refer to RD note for recommendations Likely from poor p.o.  intake from mandibular tori.  4.  Throat pain.   Etiology not clear.  No erythema on the pharynx identified at the time of examination. Add Magic mouthwash on scheduled basis.  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 CT maxillofacial suggest dense bony overgrowth from the anterior mandible bilaterally suggesting mandibular tori. Has encroachment on the floor of the mouth as well as deviates the anterior tongue as well. Likely causing poor p.o. intake and possible resultant malnutrition. Patient will need outpatient follow-up with dentistry/ENT  6.  Flatulence. Ileus. Constipation Patient reported significant Flatulence and diarrhea X-ray abdomen shows evidence of scattered gas-filled bowel loops with mild to moderate distention. There is also stool in the rectum and scattered loops of the gas-filled colon. Repeat x ray shows improvement in the findings of ileus but still has significant stool. Continue stool softener.  Tolerating oral diet.  9.  Left ICA stenosis. Vertebral artery stenosis. Vascular surgery consulted. Given the complexity of the lesion as well as calcification they recommend open surgery scheduled to be performed on outpatient basis in 10 to 14 days. Neurology was consulted for no further work-up recommended Patient was cleared for vascular surgery.  Patient was seen by physical therapy, who recommended Home health, which was arranged. On the day of the discharge the patient's vitals were stable, and no other acute medical condition were reported by patient. the patient was felt safe to be discharge at Home with Home health.  Consultants: Neurology, vascular surgery, cardiology Procedures: Echocardiogram  DISCHARGE MEDICATION: Allergies as of 09/05/2019      Reactions   Codeine    Penicillins       Medication List    TAKE these medications   aspirin 81 MG EC tablet Take 1 tablet (81 mg total) by mouth daily.     atorvastatin 80 MG tablet Commonly known as: LIPITOR Take 1 tablet (80 mg total) by mouth daily at 6 PM.   benzocaine 10 % mucosal gel Commonly known as: ORAJEL Use as directed in the mouth or throat 4 (four) times daily.   clopidogrel 75 MG tablet Commonly known as: PLAVIX Take 1 tablet (75 mg total) by mouth daily.   feeding supplement (ENSURE ENLIVE) Liqd Take 237 mLs by mouth 3 (three) times daily with meals.   magic mouthwash Soln Take 15 mLs by mouth 4 (four) times daily.   metFORMIN 500 MG tablet Commonly known as: Glucophage Take 1 tablet (500 mg total) by mouth 2 (two) times daily with a meal.   multivitamin with minerals Tabs tablet Take 1 tablet by mouth daily.   polyethylene glycol 17 g packet Commonly known as: MIRALAX / GLYCOLAX Take 17 g by mouth daily.      Allergies  Allergen Reactions  . Codeine   . Penicillins    Discharge Instructions    Ambulatory referral to Neurology   Complete by: As directed    DIET DYS 3   Complete by: As directed    Fluid consistency: Thin   Diet - low sodium heart healthy   Complete by: As directed    Discharge instructions   Complete by: As directed  It is important that you read the given instructions as well as go over your medication list with RN to help you understand your care after this hospitalization.  Please follow-up with PCP in 1-2 weeks.  Please note that NO REFILLS for any discharge medications will be authorized once you are discharged, as it is imperative that you return to your primary care physician (or establish a relationship with a primary care physician if you do not have one) for your aftercare needs so that they can reassess your need for medications and monitor your lab values.  Please request your primary care physician to go over all Hospital Tests and Procedure/Radiological results at the follow up. Please get all Hospital records sent to your PCP by signing hospital release before you go  home.    Do not take more than prescribed Pain, Sleep and Anxiety Medications.  You were cared for by a hospitalist during your hospital stay. If you have any questions about your discharge medications or the care you received while you were in the hospital after you are discharged, you can call the unit @UNIT @ you were admitted to and ask to speak with the hospitalist Berle Mull. Ask for Hospitalist on call if the hospitalist that took care of you is not available.   Once you are discharged, your primary care physician will handle any further medical issues.  You Must read complete instructions/literature along with all the possible adverse reactions/side effects for all the Medicines you take and that have been prescribed to you. Take any new Medicines after you have completely understood and accept all the possible adverse reactions/side effects.  If you have smoked or chewed Tobacco in the last 2 yrs please STOP smoking STOP any Recreational drug use.  If you drink alcohol, please safely STOP the use. Do not drive, operating heavy machinery, perform activities at heights, swimming or participation in water activities or provide baby sitting services under influence.  Wear Seat belts while driving.   Increase activity slowly   Complete by: As directed      Discharge Exam: Filed Weights   09/01/19 1028  Weight: 40.8 kg   Vitals:   09/05/19 0428 09/05/19 0757  BP: (!) 119/57 (!) 119/57  Pulse: 75 64  Resp: 16 18  Temp: 98.3 F (36.8 C) 98.5 F (36.9 C)  SpO2: 99% 100%   General: Appear in no distress, no Rash; Oral Mucosa shows Mandibular tori. no Abnormal Mass Or lumps Cardiovascular: S1 and S2 Present, no Murmur, Respiratory: normal respiratory effort, Bilateral Air entry present and Clear to Auscultation, no Crackles, no wheezes Abdomen: Bowel Sound present, Soft and no tenderness, no hernia Extremities: no Pedal edema, no calf tenderness Neurology: alert and oriented to  time, place, and person affect appropriate.  The results of significant diagnostics from this hospitalization (including imaging, microbiology, ancillary and laboratory) are listed below for reference.    Significant Diagnostic Studies: CT HEAD WO CONTRAST  Result Date: 09/01/2019 CLINICAL DATA:  Left-sided weakness EXAM: CT HEAD WITHOUT CONTRAST TECHNIQUE: Contiguous axial images were obtained from the base of the skull through the vertex without intravenous contrast. COMPARISON:  None. FINDINGS: Brain: There is mild diffuse atrophy. There is no intracranial mass, hemorrhage, extra-axial fluid collection, or midline shift. There is mild small vessel disease in the centra semiovale bilaterally. There is an area of decreased attenuation at the gray-white junction of the mid right frontal lobe, a finding concerning for recent and potentially acute infarct. There is  decreased attenuation in the head of the caudate nucleus on the right and immediately adjacent anterior limb of the right external capsule. This area may represent an older infarct. No other findings suggesting potential acute infarct present. Vascular: No hyperdense vessel. There is calcification in each carotid siphon region. Skull: The bony calvarium appears intact. Sinuses/Orbits: There is mucosal thickening involving multiple ethmoid air cells. Other visualized paranasal sinuses are clear. There is a concha bullosa on the right, an anatomic variant. There is leftward deviation of the nasal septum. Visualized orbits appear symmetric bilaterally. Other: Visualized mastoid air cells are clear. IMPRESSION: 1. Atrophy with periventricular small vessel disease. Age uncertain but potentially recent/acute infarct in the mid right frontal lobe at the gray-white junction. Older appearing infarct involving a portion of the head of the caudate nucleus on the right in the immediately adjacent anterior limb of the right external capsule. No mass or  hemorrhage. 2.  There are foci of arterial vascular calcification. 3. Mucosal thickening in several ethmoid air cells. Deviated nasal septum. Electronically Signed   By: Lowella Grip III M.D.   On: 09/01/2019 11:37   CT ANGIO NECK W OR WO CONTRAST  Result Date: 09/03/2019 CLINICAL DATA:  Stroke follow-up.  Carotid stenosis by Doppler EXAM: CT ANGIOGRAPHY NECK TECHNIQUE: Multidetector CT imaging of the neck was performed using the standard protocol during bolus administration of intravenous contrast. Multiplanar CT image reconstructions and MIPs were obtained to evaluate the vascular anatomy. Carotid stenosis measurements (when applicable) are obtained utilizing NASCET criteria, using the distal internal carotid diameter as the denominator. CONTRAST:  56mL OMNIPAQUE IOHEXOL 350 MG/ML SOLN COMPARISON:  Carotid Doppler from yesterday FINDINGS: Aortic arch: Multifocal atherosclerotic plaque. Three vessel branching. Right carotid system: Scattered mainly calcified atherosclerotic plaque along the common carotid and proximal ICA. Proximal ICA stenosis measures up to 45% as measured on reformats. No beading or ulceration. Carotid siphon atherosclerotic plaque without suspected flow reducing stenosis. The right ICA is larger than the left at least partially due to circle-of-Willis variant Left carotid system: Calcified plaque at the left common carotid origin without flow reducing stenosis. Multifocal calcified plaque along the common carotid with bulky low-density plaque along the posterior wall at the ICA bulb causing severe stenosis with short segment string sign. The downstream vessel may be under filled. Carotid siphon atherosclerotic plaque without flow reducing stenosis. Vertebral arteries: Proximal subclavian atherosclerosis without flow reducing stenosis. High-grade atheromatous narrowing at both vertebral origins, worse on the non dominant right side which is not seen beyond the PICA until reconstitution  at the distal V4 segment. Skeleton: Degenerative changes in the cervical spine. 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 Poor dentition. Other neck: No acute finding Upper chest: COPD changes. IMPRESSION: 1. Critical proximal left ICA stenosis with short segment string sign. 2. 45% proximal right ICA atheromatous stenosis. 3. High-grade narrowing at both vertebral origins with occluded V4 segment on the non dominant right side. 4. 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 Electronically Signed   By: Monte Fantasia M.D.   On: 09/03/2019 12:39   MR ANGIO HEAD WO CONTRAST  Result Date: 09/01/2019 CLINICAL DATA:  Initial evaluation for focal neural deficit, possible stroke. EXAM: MRI HEAD WITHOUT CONTRAST MRA HEAD WITHOUT CONTRAST TECHNIQUE: Multiplanar, multiecho pulse sequences of the brain and surrounding structures were obtained without intravenous contrast. Angiographic images of the head were obtained using MRA technique without contrast. COMPARISON:  Prior CT  from earlier the same day. FINDINGS: MRI HEAD FINDINGS Brain: Diffuse prominence of the CSF containing spaces compatible with generalized age-related cerebral atrophy. Patchy T2/FLAIR hyperintensity within the periventricular deep white matter both cerebral hemispheres most consistent with chronic small vessel ischemic disease, mild to moderate nature. Few scattered superimposed remote lacunar infarcts seen involving the bilateral thalami, basal ganglia, and hemispheric cerebral white matter. Small remote cortical/subcortical infarct noted involving the anterior right frontal lobe (series 20, image 32). Punctate 3 mm focus of restricted diffusion seen involving the cortex of the left parietal lobe, consistent with a small acute ischemic infarct (series 5, image 36). Additional faint diffusion abnormality seen involving the cortex of the left occipital lobe (series 5, image 22),  acute to subacute in appearance. No associated hemorrhage or mass effect. No other abnormal foci of restricted diffusion to suggest acute or subacute ischemia. Gray-white matter differentiation otherwise maintained. No acute intracranial hemorrhage. Single subcentimeter focus of susceptibility artifact noted within left parietal lobe, consistent with a small microhemorrhage, likely small vessel related. No mass lesion, midline shift or mass effect. No hydrocephalus. No extra-axial fluid collection. Pituitary gland suprasellar region within normal limits. Midline structures intact and normal. Vascular: Major intracranial vascular flow voids are maintained. Skull and upper cervical spine: Craniocervical junction within normal limits. Bone marrow signal intensity normal. No scalp soft tissue abnormality. Sinuses/Orbits: Globes and orbital soft tissues within normal limits. Paranasal sinuses are largely clear. Right-to-left nasal septal deviation with associated concha bullosa noted. 8 mm well-circumscribed T2 hyperintense nodule at the left nasal bridge, indeterminate (series 15, image 7). Other: No mastoid effusion.  Inner ear structures grossly normal. MRA HEAD FINDINGS ANTERIOR CIRCULATION: Visualized distal cervical segments of the internal carotid arteries are widely patent with symmetric antegrade flow. Petrous segments widely patent. Multifocal atheromatous irregularity throughout the cavernous/supraclinoid ICAs without high-grade flow-limiting stenosis, slightly worse on the right. ICA termini well perfused. Right A1 widely patent. Short-segment severe stenosis noted at the proximal left M1 segment (series 9, image 106). Normal anterior communicating artery. Anterior cerebral arteries widely patent to their distal aspects without high-grade stenosis. No M1 stenosis or occlusion. Normal MCA bifurcations. Distal MCA branches well perfused and symmetric, although demonstrate diffuse small vessel atheromatous  irregularity. POSTERIOR CIRCULATION: Dominant left vertebral artery widely patent to the vertebrobasilar junction. Patent left PICA. Right vertebral artery diffusely hypoplastic and not well seen, although there is flow related signal within the distal right V4 segment with perfusion of the right PICA. Basilar widely patent to its distal aspect without stenosis. Superior cerebral arteries patent bilaterally. Left PCA is supplied primarily via the basilar. Right PCA supplied via the basilar as well as a robust right posterior communicating artery. Multifocal atheromatous irregularity throughout both PCAs, which are both patent to their distal aspects. Short-segment severe distal right P3 stenosis noted (series 1059, image 6). No intracranial aneurysm or other vascular abnormality. IMPRESSION: MRI HEAD IMPRESSION: 1. Few tiny subcentimeter acute to early subacute cortical infarcts involving the left parietal and occipital lobes as above. No associated hemorrhage. 2. Underlying age-related cerebral atrophy with moderate chronic microvascular ischemic disease. 3. 8 mm nodule involving the skin of the left nasal bridge, indeterminate. Correlation with physical exam recommended. MRA HEAD IMPRESSION: 1. No large vessel occlusion. 2. 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 No other proximal high-grade or correctable stenosis. Electronically Signed   By: Jeannine Boga M.D.   On: 09/01/2019 22:01   MR BRAIN WO CONTRAST  Result Date: 09/01/2019 CLINICAL DATA:  Initial evaluation for focal neural deficit, possible stroke. EXAM: MRI HEAD WITHOUT CONTRAST MRA HEAD WITHOUT CONTRAST TECHNIQUE: Multiplanar, multiecho pulse sequences of the brain and surrounding structures were obtained without intravenous contrast. Angiographic images of the head were obtained using MRA technique without contrast. COMPARISON:  Prior CT from earlier the same day. FINDINGS:  MRI HEAD FINDINGS Brain: Diffuse prominence of the CSF containing spaces compatible with generalized age-related cerebral atrophy. Patchy T2/FLAIR hyperintensity within the periventricular deep white matter both cerebral hemispheres most consistent with chronic small vessel ischemic disease, mild to moderate nature. Few scattered superimposed remote lacunar infarcts seen involving the bilateral thalami, basal ganglia, and hemispheric cerebral white matter. Small remote cortical/subcortical infarct noted involving the anterior right frontal lobe (series 20, image 32). Punctate 3 mm focus of restricted diffusion seen involving the cortex of the left parietal lobe, consistent with a small acute ischemic infarct (series 5, image 36). Additional faint diffusion abnormality seen involving the cortex of the left occipital lobe (series 5, image 22), acute to subacute in appearance. No associated hemorrhage or mass effect. No other abnormal foci of restricted diffusion to suggest acute or subacute ischemia. Gray-white matter differentiation otherwise maintained. No acute intracranial hemorrhage. Single subcentimeter focus of susceptibility artifact noted within left parietal lobe, consistent with a small microhemorrhage, likely small vessel related. No mass lesion, midline shift or mass effect. No hydrocephalus. No extra-axial fluid collection. Pituitary gland suprasellar region within normal limits. Midline structures intact and normal. Vascular: Major intracranial vascular flow voids are maintained. Skull and upper cervical spine: Craniocervical junction within normal limits. Bone marrow signal intensity normal. No scalp soft tissue abnormality. Sinuses/Orbits: Globes and orbital soft tissues within normal limits. Paranasal sinuses are largely clear. Right-to-left nasal septal deviation with associated concha bullosa noted. 8 mm well-circumscribed T2 hyperintense nodule at the left nasal bridge, indeterminate (series 15,  image 7). Other: No mastoid effusion.  Inner ear structures grossly normal. MRA HEAD FINDINGS ANTERIOR CIRCULATION: Visualized distal cervical segments of the internal carotid arteries are widely patent with symmetric antegrade flow. Petrous segments widely patent. Multifocal atheromatous irregularity throughout the cavernous/supraclinoid ICAs without high-grade flow-limiting stenosis, slightly worse on the right. ICA termini well perfused. Right A1 widely patent. Short-segment severe stenosis noted at the proximal left M1 segment (series 9, image 106). Normal anterior communicating artery. Anterior cerebral arteries widely patent to their distal aspects without high-grade stenosis. No M1 stenosis or occlusion. Normal MCA bifurcations. Distal MCA branches well perfused and symmetric, although demonstrate diffuse small vessel atheromatous irregularity. POSTERIOR CIRCULATION: Dominant left vertebral artery widely patent to the vertebrobasilar junction. Patent left PICA. Right vertebral artery diffusely hypoplastic and not well seen, although there is flow related signal within the distal right V4 segment with perfusion of the right PICA. Basilar widely patent to its distal aspect without stenosis. Superior cerebral arteries patent bilaterally. Left PCA is supplied primarily via the basilar. Right PCA supplied via the basilar as well as a robust right posterior communicating artery. Multifocal atheromatous irregularity throughout both PCAs, which are both patent to their distal aspects. Short-segment severe distal right P3 stenosis noted (series 1059, image 6). No intracranial aneurysm or other vascular abnormality. IMPRESSION: MRI HEAD IMPRESSION: 1. Few tiny subcentimeter acute to early subacute cortical infarcts involving the left parietal and occipital lobes as above. No associated hemorrhage. 2. Underlying age-related cerebral atrophy with moderate chronic microvascular ischemic disease. 3. 8 mm nodule involving  the skin of the left nasal bridge, indeterminate.  Correlation with physical exam recommended. MRA HEAD IMPRESSION: 1. No large vessel occlusion. 2. 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 No other proximal high-grade or correctable stenosis. Electronically Signed   By: Jeannine Boga M.D.   On: 09/01/2019 22:01   US Carotid Bilateral (at [facility] and [facility] only)  Result Date: 09/02/2019 CLINICAL DATA:  Left-sided weakness and cerebral infarction. EXAM: BILATERAL CAROTID DUPLEX ULTRASOUND TECHNIQUE: Pearline Cables scale imaging, color Doppler and duplex ultrasound were performed of bilateral carotid and vertebral arteries in the neck. COMPARISON:  None. FINDINGS: Criteria: Quantification of carotid stenosis is based on velocity parameters that correlate the residual internal carotid diameter with NASCET-based stenosis levels, using the diameter of the distal internal carotid lumen as the denominator for stenosis measurement. The following velocity measurements were obtained: RIGHT ICA:  154/51 distal, 69/18 proximal cm/sec CCA:  0000000 cm/sec SYSTOLIC ICA/CCA RATIO:  1.8 ECA:  87 cm/sec LEFT ICA:  390/133 cm/sec CCA:  [id number] cm/sec SYSTOLIC ICA/CCA RATIO:  7.3 ECA:  99 cm/sec RIGHT CAROTID ARTERY: Moderate calcified plaque in the distal common carotid artery, carotid bulb and internal carotid artery. Estimated proximal right ICA stenosis is less than 50%. RIGHT VERTEBRAL ARTERY: Antegrade flow with normal waveform and velocity. LEFT CAROTID ARTERY: Severe amount of predominately calcified plaque is present at the level of the distal bulb and proximal left ICA. Turbulent flow and elevated velocities correspond to an estimated greater than 70% left ICA stenosis. LEFT VERTEBRAL ARTERY: Antegrade flow with normal waveform and velocity. IMPRESSION: 1. Significant estimated greater than 70% proximal left ICA stenosis. 2. Estimated less than 50% proximal right ICA  stenosis. Electronically Signed   By: Aletta Edouard M.D.   On: 09/02/2019 13:40   DG Chest Portable 1 View  Result Date: 09/01/2019 CLINICAL DATA:  Weakness with productive cough. EXAM: PORTABLE CHEST 1 VIEW COMPARISON:  None. FINDINGS: Lungs are hyperexpanded without consolidation or effusion. Cardiomediastinal silhouette, bones and soft tissues are normal. IMPRESSION: Hyperexpansion without acute cardiopulmonary disease. Electronically Signed   By: Marin Olp M.D.   On: 09/01/2019 11:37   DG Abd Portable 1V  Result Date: 09/04/2019 CLINICAL DATA:  68 year old male with constipation. Suspected ileus. EXAM: PORTABLE ABDOMEN - 1 VIEW COMPARISON:  Abdominal radiographs 09/02/2019. CTA neck yesterday. FINDINGS: 2 supine views of the abdomen and pelvis at 0755 hours. Excreted IV contrast in the urinary bladder. Decreased small bowel gas and non obstructed bowel gas pattern currently. Increased volume of retained stool in the right colon. Paucity of distal stool. Abdominal visceral contours are within normal limits. Probable pulmonary hyperinflation but otherwise negative visible lung bases. Degenerative endplate spurring with disc space loss in the lumbar spine. No acute osseous abnormality identified. Calcified iliac atherosclerosis. IMPRESSION: 1. Normalized bowel gas pattern, no evidence of bowel obstruction. Increased retained stool in the right colon, but paucity of distal stool 2. Calcified atherosclerosis. Electronically Signed   By: Genevie Ann M.D.   On: 09/04/2019 08:26   DG Abd Portable 1V  Result Date: 09/02/2019 CLINICAL DATA:  Constipation, intermittent constipation. EXAM: PORTABLE ABDOMEN - 1 VIEW COMPARISON:  None. FINDINGS: Lung bases are clear. No signs of free air on supine radiography. Scattered gas-filled loops of small bowel with mild distension to moderate distension. Gas and stool in the rectum and scattered loops of gas-filled colon. No acute bone finding. IMPRESSION: Findings  most suggestive of global ileus. Early partial small bowel obstruction might have a similar appearance, consider continued follow-up  for further assessment. Electronically Signed   By: Zetta Bills M.D.   On: 09/02/2019 10:34   ECHOCARDIOGRAM COMPLETE  Result Date: 09/02/2019   ECHOCARDIOGRAM REPORT   Patient Name:   TUDOR LEGNER Date of Exam: 09/02/2019 Medical Rec #:  [id number]      Height:       74.0 in Accession #:    [id number]     Weight:       90.0 lb Date of Birth:  January 05, 1951      BSA:          1.55 m Patient Age:    80 years       BP:           146/72 mmHg Patient Gender: M              HR:           67 bpm. Exam Location:  [facility] Procedure: 2D Echo Indications:     Stroke 434.91/I163.9  History:         Patient has no prior history of Echocardiogram examinations.                  Risk Factors:Hypertension.  Sonographer:     Avanell Shackleton Referring Phys:  Unknown Foley NIU Diagnosing Phys: Skeet Latch MD  Sonographer Comments: Technically difficult study due to poor echo windows. IMPRESSIONS  1. Left ventricular ejection fraction, by visual estimation, is 60 to 65%. The left ventricle has normal function. Left ventricular septal wall thickness was normal. Normal left ventricular posterior wall thickness. There is no left ventricular hypertrophy.  2. Left ventricular diastolic parameters are consistent with Grade II diastolic dysfunction (pseudonormalization).  3. The left ventricle has no regional wall motion abnormalities.  4. Global right ventricle has normal systolic function.The right ventricular size is normal. No increase in right ventricular wall thickness.  5. Left atrial size was normal.  6. Right atrial size was normal.  7. The mitral valve is normal in structure. No evidence of mitral valve regurgitation. No evidence of mitral stenosis.  8. The tricuspid valve is normal in structure. Tricuspid valve regurgitation is mild.  9. The aortic valve is normal in structure. Aortic valve  regurgitation is not visualized. No evidence of aortic valve sclerosis or stenosis. 10. The pulmonic valve was normal in structure. Pulmonic valve regurgitation is not visualized. 11. Normal pulmonary artery systolic pressure. 12. The inferior vena cava is normal in size with greater than 50% respiratory variability, suggesting right atrial pressure of 3 mmHg. FINDINGS  Left Ventricle: Left ventricular ejection fraction, by visual estimation, is 60 to 65%. The left ventricle has normal function. The left ventricle has no regional wall motion abnormalities. Normal left ventricular posterior wall thickness. There is no left ventricular hypertrophy. Left ventricular diastolic parameters are consistent with Grade II diastolic dysfunction (pseudonormalization). Indeterminate filling pressures. Right Ventricle: The right ventricular size is normal. No increase in right ventricular wall thickness. Global RV systolic function is has normal systolic function. The tricuspid regurgitant velocity is 2.43 m/s, and with an assumed right atrial pressure  of 3 mmHg, the estimated right ventricular systolic pressure is normal at 26.7 mmHg. Left Atrium: Left atrial size was normal in size. Right Atrium: Right atrial size was normal in size Pericardium: There is no evidence of pericardial effusion. Mitral Valve: The mitral valve is normal in structure. No evidence of mitral valve regurgitation. No evidence of mitral valve stenosis by observation. Tricuspid Valve: The tricuspid  valve is normal in structure. Tricuspid valve regurgitation is mild. Aortic Valve: The aortic valve is normal in structure. Aortic valve regurgitation is not visualized. The aortic valve is structurally normal, with no evidence of sclerosis or stenosis. Pulmonic Valve: The pulmonic valve was normal in structure. Pulmonic valve regurgitation is not visualized. Pulmonic regurgitation is not visualized. Aorta: The aortic root, ascending aorta and aortic arch are  all structurally normal, with no evidence of dilitation or obstruction. Venous: The inferior vena cava is normal in size with greater than 50% respiratory variability, suggesting right atrial pressure of 3 mmHg. IAS/Shunts: No atrial level shunt detected by color flow Doppler. There is no evidence of a patent foramen ovale. No ventricular septal defect is seen or detected. There is no evidence of an atrial septal defect.  LEFT VENTRICLE PLAX 2D LVIDd:         2.54 cm  Diastology LVIDs:         1.25 cm  LV e' lateral:   7.62 cm/s LV PW:         0.90 cm  LV E/e' lateral: 9.6 LV IVS:        0.84 cm  LV e' medial:    6.20 cm/s LVOT diam:     2.00 cm  LV E/e' medial:  11.7 LV SV:         19 ml LV SV Index:   13.70 LVOT Area:     3.14 cm  RIGHT VENTRICLE             IVC RV S prime:     13.40 cm/s  IVC diam: 1.30 cm TAPSE (M-mode): 2.1 cm LEFT ATRIUM             Index       RIGHT ATRIUM           Index LA diam:        2.30 cm 1.49 cm/m  RA Area:     10.30 cm LA Vol (A2C):   29.0 ml 18.73 ml/m RA Volume:   21.90 ml  14.15 ml/m LA Vol (A4C):   14.8 ml 9.56 ml/m LA Biplane Vol: 22.3 ml 14.41 ml/m   AORTA Ao Root diam: 3.20 cm MITRAL VALVE                        TRICUSPID VALVE MV Area (PHT): 3.37 cm             TR Peak grad:   23.7 mmHg MV PHT:        65.25 msec           TR Vmax:        273.00 cm/s MV Decel Time: 225 msec MV E velocity: 72.80 cm/s 103 cm/s  SHUNTS MV A velocity: 58.20 cm/s 70.3 cm/s Systemic Diam: 2.00 cm MV E/A ratio:  1.25       1.5  Skeet Latch MD Electronically signed by Skeet Latch MD Signature Date/Time: 09/02/2019/2:32:21 PM    Final    CT Maxillofacial Wo Contrast  Result Date: 09/01/2019 CLINICAL DATA:  Facial swelling EXAM: CT MAXILLOFACIAL WITHOUT CONTRAST TECHNIQUE: Multidetector CT imaging of the maxillofacial structures was performed. Multiplanar CT image reconstructions were also generated. COMPARISON:  None. FINDINGS: Osseous: No acute fracture. Bony nasal septum is  deviated to the left without fracture. Orbital walls are intact. Temporomandibular joints are aligned without dislocation. 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 Right-sided lesion measures up to 2.5 cm with encroachment on the floor of mouth and superiorly deviates the anterior tongue. Remaining mandibular dentition with small periapical lucencies. No cortical destruction or periostitis. Orbits: Negative. No traumatic or inflammatory finding. Sinuses: Clear. Soft tissues: No focal soft tissue swelling. No fluid collection. Limited intracranial: See concurrent CT brain report. IMPRESSION: 1. No acute facial bone fracture. 2. Remaining mandibular dentition with small periapical lucencies. No cortical destruction or periostitis. No focal soft tissue swelling or fluid collection. 3. 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 Right-sided lesion measures up to 2.5 cm with encroachment on the floor of mouth and superior deviation of the tongue. Electronically Signed   By: Davina Poke M.D.   On: 09/01/2019 11:50    Microbiology: Recent Results (from the past 240 hour(s))  SARS CORONAVIRUS 2 (TAT 6-24 HRS) Nasopharyngeal Nasopharyngeal Swab     Status: None   Collection Time: 09/01/19 12:28 PM   Specimen: Nasopharyngeal Swab  Result Value Ref Range Status   SARS Coronavirus 2 NEGATIVE NEGATIVE Final    Comment: (NOTE) SARS-CoV-2 target nucleic acids are NOT DETECTED. The SARS-CoV-2 RNA is generally detectable in upper and lower respiratory specimens during the acute phase of infection. Negative results do not preclude SARS-CoV-2 infection, do not rule out co-infections with other pathogens, and should not be used as the sole basis for treatment or other patient management decisions. Negative results must  be combined with clinical observations, patient history, and epidemiological information. The expected result is Negative. Fact Sheet for Patients: SugarRoll.be Fact Sheet for Healthcare Providers: https://www.woods-mathews.com/ This test is not yet approved or cleared by the [place] FDA and  has been authorized for detection and/or diagnosis of SARS-CoV-2 by FDA under an Emergency Use Authorization (EUA). This EUA will remain  in effect (meaning this test can be used) for the duration of the COVID-19 declaration under Section 56 4(b)(1) of the Act, 21 U.S.C. section 360bbb-3(b)(1), unless the authorization is terminated or revoked sooner. Performed at [facility], [place] [address], [place], [place] [address]      Labs: CBC: Recent Labs  Lab 09/01/19 1038 09/04/19 0531 09/05/19 0511  WBC 7.5 4.9 4.0  NEUTROABS 5.5  --   --   HGB 12.4* 8.7* 8.7*  HCT 36.1* 25.3* 24.9*  MCV 102.6* 103.3* 101.2*  PLT 305 210 [id number]   Basic Metabolic Panel: Recent Labs  Lab 09/01/19 1038 09/04/19 0531 09/05/19 0511  NA 140 139 136  K 4.1 3.7 3.9  CL 107 106 101  CO2 20* 25 28  GLUCOSE 107* 92 89  BUN 23 16 10   CREATININE 0.91 0.52* 0.48*  CALCIUM 10.0 8.5* 8.5*  MG  --  1.7 2.0  PHOS  --  1.7* 1.8*   Liver Function Tests: Recent Labs  Lab 09/01/19 1038  AST 23  ALT 9  ALKPHOS 48  BILITOT 0.6  PROT 7.5  ALBUMIN 3.8   No results for input(s): LIPASE, AMYLASE in the last 168 hours. No results for input(s): AMMONIA in the last 168 hours. Cardiac Enzymes: No results for input(s): CKTOTAL, CKMB, CKMBINDEX, TROPONINI in the last 168 hours. BNP (last 3 results) No results for input(s): BNP in the last 8760 hours. CBG: Recent Labs  Lab 09/04/19 1226 09/04/19 1647 09/04/19 2128 09/05/19 0756 09/05/19 1133  GLUCAP 107* 90 106*  93 112*    Time spent: 35 minutes  Signed:  Berle Mull  [facility] 09/05/2019 6:05  PM

## 2019-09-12 ENCOUNTER — Other Ambulatory Visit: Payer: Self-pay

## 2019-09-12 ENCOUNTER — Encounter
Admission: RE | Admit: 2019-09-12 | Discharge: 2019-09-12 | Disposition: A | Discharge status: Home / Self Care | Payer: Medicare Other | Source: Ambulatory Visit | Attending: Vascular Surgery | Admitting: Vascular Surgery

## 2019-09-12 ENCOUNTER — Encounter: Payer: Self-pay | Admitting: Vascular Surgery

## 2019-09-12 ENCOUNTER — Other Ambulatory Visit (INDEPENDENT_AMBULATORY_CARE_PROVIDER_SITE_OTHER): Payer: Self-pay | Admitting: Nurse Practitioner

## 2019-09-12 NOTE — Patient Instructions (Addendum)
INSTRUCTIONS FOR SURGERY     Your surgery is scheduled for:   Thursday, September 21, 2019     To find out your arrival time for the day of surgery,          please call [phone] between 1 pm and 3 pm on :  Wednesday, September 20, 2019    When you arrive for surgery, report to the [facility].       Do NOT stop on the first floor to register.    REMEMBER: Instructions that are not followed completely may result in serious medical risk,  up to and including death, or upon the discretion of your surgeon and anesthesiologist,            your surgery may need to be rescheduled.  __X__ 1. Do not eat food after midnight the night before your procedure.                    No gum, candy, lozenger, tic tacs, tums or hard candies.                  ABSOLUTELY NOTHING SOLID IN YOUR MOUTH AFTER MIDNIGHT                    You may drink unlimited clear liquids up to 2 hours before you are scheduled to arrive for surgery.                   Do not drink anything within those 2 hours unless you need to take medicine, then take the                   smallest amount you need.  Clear liquids include:  water, apple juice without pulp,                   any flavor Gatorade, Black coffee, black tea.  Sugar may be added but no dairy/ honey /lemon.                        Broth and jello is not considered a clear liquid.  __x__  2. On the morning of surgery, please brush your teeth with toothpaste and water. You may rinse with                  mouthwash if you wish but DO NOT SWALLOW TOOTHPASTE OR MOUTHWASH  __X___3. NO alcohol for 24 hours before or after surgery.  __x___ 4.  Do NOT smoke or use e-cigarettes for 24 HOURS PRIOR TO SURGERY.                      DO NOT Use any chewable tobacco products for at least 6 hours prior to surgery.  __x___ 5. If you start any new medication after this appointment and prior to surgery, please                    Bring it with you on the day of surgery.  ___x__ 6. Notify your doctor if there is any change  in your medical condition, such as fever, infection, vomitting,                   Diarrhea or any open sores.  __x___ 7.  USE the CHG SOAP as instructed, the night before surgery and the day of surgery.                   Once you have washed with this soap, do NOT use any of the following: Powders, perfumes                    or lotions. Please do not wear make up, hairpins, clips or nail polish. You may wear deodorant.                   Men may shave their face and neck.  Women need to shave 48 hours prior to surgery.                   DO NOT wear ANY jewelry on the day of surgery. If there are rings that are too tight to                    remove easily, please address this prior to the surgery day. Piercings need to be removed.                                                                     NO METAL ON YOUR BODY.                    Do NOT bring any valuables.  If you came to Pre-Admit testing then you will not need license,                     insurance card or credit card.  If you will be staying overnight, please either leave your things in                     the car or have your family be responsible for these items.                      IS NOT RESPONSIBLE FOR BELONGINGS OR VALUABLES.  ___X__ 8. DO NOT wear contact lenses on surgery day.  You may not have dentures,                     Hearing aides, contacts or glasses in the operating room. These items can be                    Placed in the Recovery Room to receive immediately after surgery.  __x___ 9. IF YOU ARE SCHEDULED TO GO HOME ON THE SAME DAY, YOU MUST                   Have someone to drive you home and to stay with you  for the first 24 hours.                    Have an arrangement prior to arriving on surgery day.  ___x__ 10. Take the following medications  on the morning of surgery with a sip of  water:                              1.  BABY ASPIRIN                     2.                       3.                               _____ 11.  Follow any instructions provided to you by your surgeon.                        Such as enema, clear liquid bowel prep  __X__  12. STOP  PLAVIX AS OF:  January 2ND.  (5 days prior to surgery)                       THIS INCLUDES BC POWDERS / GOODIES POWDER  __x___ 13. STOP Anti-inflammatories as of:  September 14, 2019                      This includes IBUPROFEN / MOTRIN / ADVIL / ALEVE/ NAPROXYN                    YOU MAY TAKE TYLENOL ANY TIME PRIOR TO SURGERY.  __x___ 14.  Continue taking your multivitamins, but do not take on the morning of surgery.   ___x___16.  Stop Metformin 2 full days prior to surgery.  Last dose on January 4th after dinner.                                        Do NOT take any diabetes medications on surgery day.  __x____17.  Continue to take the following medications but do not take on the morning of surgery:                               Do not take Multivitamins on the day of surgery.  __x____18. If staying overnight, please have appropriate shoes to wear to be able to walk around the unit.                   Wear clean and comfortable clothing to the hospital.  You may continue taking Miralax as needed.  Also, continue taking Lipitor in the evening. Enjoy your Ensure drinks up until the day of surgery.    Make sure to bring the phone number for Vivien Rota, especially if she does not stay at the hospital. Do not eat any sugar tablets on the day of surgery if your sugar feels low.  Drink a sugary drink instead,     Such as gingerale or gatorade.

## 2019-09-12 NOTE — Pre-Procedure Instructions (Signed)
Patient had recent stroke.  Continues to have weakness in legs, slurring of speech.  Denies weakness in upper extremities.  Instructed via phone to stop Plavix 5 days prior to surgery and to continue his aspirin.

## 2019-09-19 ENCOUNTER — Other Ambulatory Visit
Admission: RE | Admit: 2019-09-19 | Discharge: 2019-09-19 | Disposition: A | Discharge status: Home / Self Care | Payer: Medicare Other | Source: Ambulatory Visit | Attending: Vascular Surgery | Admitting: Vascular Surgery

## 2019-09-19 ENCOUNTER — Other Ambulatory Visit: Payer: Self-pay

## 2019-09-19 LAB — CBC WITH DIFFERENTIAL/PLATELET
Abs Immature Granulocytes: 0.02 10*3/uL (ref 0.00–0.07)
Basophils Absolute: 0.1 10*3/uL (ref 0.0–0.1)
Basophils Relative: 1 %
Eosinophils Absolute: 0.1 10*3/uL (ref 0.0–0.5)
Eosinophils Relative: 1 %
HCT: 32 % — ABNORMAL LOW (ref 39.0–52.0)
Hemoglobin: 10.6 g/dL — ABNORMAL LOW (ref 13.0–17.0)
Immature Granulocytes: 0 %
Lymphocytes Relative: 32 %
Lymphs Abs: 1.7 10*3/uL (ref 0.7–4.0)
MCH: 36.2 pg — ABNORMAL HIGH (ref 26.0–34.0)
MCHC: 33.1 g/dL (ref 30.0–36.0)
MCV: 109.2 fL — ABNORMAL HIGH (ref 80.0–100.0)
Monocytes Absolute: 0.4 10*3/uL (ref 0.1–1.0)
Monocytes Relative: 7 %
Neutro Abs: 3.1 10*3/uL (ref 1.7–7.7)
Neutrophils Relative %: 59 %
Platelets: 482 10*3/uL — ABNORMAL HIGH (ref 150–400)
RBC: 2.93 MIL/uL — ABNORMAL LOW (ref 4.22–5.81)
RDW: 16.4 % — ABNORMAL HIGH (ref 11.5–15.5)
WBC: 5.3 10*3/uL (ref 4.0–10.5)
nRBC: 0 % (ref 0.0–0.2)

## 2019-09-19 LAB — BASIC METABOLIC PANEL
Anion gap: 11 (ref 5–15)
BUN: 18 mg/dL (ref 8–23)
CO2: 24 mmol/L (ref 22–32)
Calcium: 9.4 mg/dL (ref 8.9–10.3)
Chloride: 102 mmol/L (ref 98–111)
Creatinine, Ser: 0.68 mg/dL (ref 0.61–1.24)
GFR calc Af Amer: >60 mL/min (ref >60)
GFR calc non Af Amer: >60 mL/min (ref >60)
Glucose, Bld: 112 mg/dL — ABNORMAL HIGH (ref 70–99)
Potassium: 4 mmol/L (ref 3.5–5.1)
Sodium: 137 mmol/L (ref 135–145)

## 2019-09-19 LAB — TYPE AND SCREEN
ABO/RH(D): O POS
Antibody Screen: NEGATIVE

## 2019-09-19 LAB — PROTIME-INR
INR: 0.9 (ref 0.8–1.2)
Prothrombin Time: 11.7 seconds (ref 11.4–15.2)

## 2019-09-19 LAB — SARS CORONAVIRUS 2 (TAT 6-24 HRS): SARS Coronavirus 2: NEGATIVE

## 2019-09-19 LAB — APTT: aPTT: 30 seconds (ref 24–36)

## 2019-09-20 ENCOUNTER — Other Ambulatory Visit (INDEPENDENT_AMBULATORY_CARE_PROVIDER_SITE_OTHER): Payer: Self-pay | Admitting: Nurse Practitioner

## 2019-09-20 MED ORDER — CLINDAMYCIN PHOSPHATE 300 MG/50ML IV SOLN
300.0000 mg | INTRAVENOUS | Status: AC
  Administered 2019-09-21: 13:00:00 300 mg via INTRAVENOUS

## 2019-09-21 ENCOUNTER — Inpatient Hospital Stay: Payer: Medicare Other | Admitting: Registered Nurse

## 2019-09-21 ENCOUNTER — Encounter
Admission: RE | Disposition: A | Discharge status: Home / Self Care | Payer: Self-pay | Source: Home / Self Care | Attending: Vascular Surgery

## 2019-09-21 ENCOUNTER — Encounter: Payer: Self-pay | Admitting: Vascular Surgery

## 2019-09-21 ENCOUNTER — Other Ambulatory Visit: Payer: Self-pay

## 2019-09-21 ENCOUNTER — Inpatient Hospital Stay
Admission: RE | Admit: 2019-09-21 | Discharge: 2019-09-22 | DRG: 037 | Disposition: A | Discharge status: Home / Self Care | Payer: Medicare Other | Attending: Vascular Surgery | Admitting: Vascular Surgery

## 2019-09-21 DIAGNOSIS — E119 Type 2 diabetes mellitus without complications: Secondary | ICD-10-CM | POA: Diagnosis present

## 2019-09-21 DIAGNOSIS — I69328 Other speech and language deficits following cerebral infarction: Secondary | ICD-10-CM

## 2019-09-21 DIAGNOSIS — Z885 Allergy status to narcotic agent status: Secondary | ICD-10-CM | POA: Diagnosis not present

## 2019-09-21 DIAGNOSIS — Z681 Body mass index (BMI) 19 or less, adult: Secondary | ICD-10-CM | POA: Diagnosis not present

## 2019-09-21 DIAGNOSIS — F329 Major depressive disorder, single episode, unspecified: Secondary | ICD-10-CM | POA: Diagnosis present

## 2019-09-21 DIAGNOSIS — E43 Unspecified severe protein-calorie malnutrition: Secondary | ICD-10-CM | POA: Diagnosis present

## 2019-09-21 DIAGNOSIS — Z20822 Contact with and (suspected) exposure to covid-19: Secondary | ICD-10-CM | POA: Diagnosis present

## 2019-09-21 DIAGNOSIS — Z87891 Personal history of nicotine dependence: Secondary | ICD-10-CM | POA: Diagnosis not present

## 2019-09-21 DIAGNOSIS — I6522 Occlusion and stenosis of left carotid artery: Secondary | ICD-10-CM

## 2019-09-21 DIAGNOSIS — R05 Cough: Secondary | ICD-10-CM | POA: Diagnosis present

## 2019-09-21 DIAGNOSIS — F419 Anxiety disorder, unspecified: Secondary | ICD-10-CM | POA: Diagnosis present

## 2019-09-21 DIAGNOSIS — M1990 Unspecified osteoarthritis, unspecified site: Secondary | ICD-10-CM | POA: Diagnosis present

## 2019-09-21 DIAGNOSIS — I69398 Other sequelae of cerebral infarction: Secondary | ICD-10-CM

## 2019-09-21 DIAGNOSIS — Z880 Allergy status to penicillin: Secondary | ICD-10-CM | POA: Diagnosis not present

## 2019-09-21 DIAGNOSIS — K089 Disorder of teeth and supporting structures, unspecified: Secondary | ICD-10-CM | POA: Diagnosis present

## 2019-09-21 DIAGNOSIS — I63239 Cerebral infarction due to unspecified occlusion or stenosis of unspecified carotid arteries: Secondary | ICD-10-CM | POA: Diagnosis present

## 2019-09-21 HISTORY — PX: ENDARTERECTOMY: SHX5162

## 2019-09-21 HISTORY — DX: Depression, unspecified: F32.A

## 2019-09-21 HISTORY — DX: Anxiety disorder, unspecified: F41.9

## 2019-09-21 HISTORY — DX: Headache, unspecified: R51.9

## 2019-09-21 HISTORY — DX: Type 2 diabetes mellitus without complications: E11.9

## 2019-09-21 HISTORY — DX: Dyspnea, unspecified: R06.00

## 2019-09-21 LAB — GLUCOSE, CAPILLARY
Glucose-Capillary: 71 mg/dL (ref 70–99)
Glucose-Capillary: 90 mg/dL (ref 70–99)

## 2019-09-21 LAB — SURGICAL PCR SCREEN
MRSA, PCR: NEGATIVE
Staphylococcus aureus: NEGATIVE

## 2019-09-21 LAB — ABO/RH: ABO/RH(D): O POS

## 2019-09-21 SURGERY — ENDARTERECTOMY, CAROTID
Anesthesia: General | Laterality: Left

## 2019-09-21 MED ORDER — REMIFENTANIL HCL 1 MG IV SOLR
INTRAVENOUS | Status: DC | PRN
  Administered 2019-09-21: .1 ug/kg/min via INTRAVENOUS

## 2019-09-21 MED ORDER — MORPHINE SULFATE (PF) 4 MG/ML IV SOLN
2.0000 mg | INTRAVENOUS | Status: DC | PRN
  Administered 2019-09-22: 4 mg via INTRAVENOUS

## 2019-09-21 MED ORDER — FENTANYL CITRATE (PF) 100 MCG/2ML IJ SOLN
12.5000 ug | Freq: Once | INTRAMUSCULAR | Status: AC | PRN
  Administered 2019-09-21: 12.5 ug via INTRAVENOUS

## 2019-09-21 MED ORDER — SODIUM CHLORIDE 0.9 % IV SOLN: INTRAVENOUS | Status: DC

## 2019-09-21 MED ORDER — NITROGLYCERIN 0.2 MG/ML ON CALL CATH LAB
INTRAVENOUS | Status: DC | PRN
  Administered 2019-09-21 (×4): 20 ug via INTRAVENOUS

## 2019-09-21 MED ORDER — HYDRALAZINE HCL 20 MG/ML IJ SOLN: 5.0000 mg | INTRAMUSCULAR | Status: DC | PRN

## 2019-09-21 MED ORDER — LIDOCAINE HCL (PF) 1 % IJ SOLN
INTRAMUSCULAR | Status: AC
  Filled 2019-09-21: qty 30

## 2019-09-21 MED ORDER — ESMOLOL HCL-SODIUM CHLORIDE 2000 MG/100ML IV SOLN
25.0000 ug/kg/min | INTRAVENOUS | Status: DC
  Filled 2019-09-21: qty 100

## 2019-09-21 MED ORDER — BENZOCAINE 10 % MT GEL
Freq: Four times a day (QID) | OROMUCOSAL | Status: DC
  Filled 2019-09-21: qty 9

## 2019-09-21 MED ORDER — METOPROLOL TARTRATE 5 MG/5ML IV SOLN: 2.0000 mg | INTRAVENOUS | Status: DC | PRN

## 2019-09-21 MED ORDER — REMIFENTANIL HCL 1 MG IV SOLR: INTRAVENOUS | Status: DC | PRN

## 2019-09-21 MED ORDER — ROCURONIUM BROMIDE 100 MG/10ML IV SOLN
INTRAVENOUS | Status: DC | PRN
  Administered 2019-09-21: 20 mg via INTRAVENOUS
  Administered 2019-09-21: 50 mg via INTRAVENOUS

## 2019-09-21 MED ORDER — SUCCINYLCHOLINE CHLORIDE 20 MG/ML IJ SOLN
INTRAMUSCULAR | Status: AC
  Filled 2019-09-21: qty 1

## 2019-09-21 MED ORDER — PHENYLEPHRINE HCL (PRESSORS) 10 MG/ML IV SOLN
INTRAVENOUS | Status: DC | PRN
  Administered 2019-09-21: 100 ug via INTRAVENOUS
  Administered 2019-09-21: 50 ug via INTRAVENOUS
  Administered 2019-09-21 (×2): 100 ug via INTRAVENOUS
  Administered 2019-09-21: 50 ug via INTRAVENOUS
  Administered 2019-09-21: 100 ug via INTRAVENOUS

## 2019-09-21 MED ORDER — FAMOTIDINE IN NACL 20-0.9 MG/50ML-% IV SOLN
20.0000 mg | Freq: Two times a day (BID) | INTRAVENOUS | Status: DC
  Administered 2019-09-21 – 2019-09-22 (×2): 20 mg via INTRAVENOUS
  Filled 2019-09-21 (×5): qty 50

## 2019-09-21 MED ORDER — METFORMIN HCL 500 MG PO TABS
500.0000 mg | ORAL_TABLET | Freq: Two times a day (BID) | ORAL | Status: DC
  Administered 2019-09-22: 500 mg via ORAL
  Filled 2019-09-21 (×3): qty 1

## 2019-09-21 MED ORDER — ACETAMINOPHEN 325 MG PO TABS
ORAL_TABLET | ORAL | Status: AC
  Administered 2019-09-21: 650 mg via ORAL
  Filled 2019-09-21: qty 2

## 2019-09-21 MED ORDER — NITROGLYCERIN IN D5W 200-5 MCG/ML-% IV SOLN
5.0000 ug/min | INTRAVENOUS | Status: DC
  Filled 2019-09-21: qty 250

## 2019-09-21 MED ORDER — MAGNESIUM SULFATE 2 GM/50ML IV SOLN
2.0000 g | Freq: Every day | INTRAVENOUS | Status: DC | PRN
  Filled 2019-09-21: qty 50

## 2019-09-21 MED ORDER — PHENOL 1.4 % MT LIQD
1.0000 | OROMUCOSAL | Status: DC | PRN
  Filled 2019-09-21: qty 177

## 2019-09-21 MED ORDER — FAMOTIDINE 20 MG PO TABS
ORAL_TABLET | ORAL | Status: AC
  Filled 2019-09-21: qty 1

## 2019-09-21 MED ORDER — EPHEDRINE SULFATE 50 MG/ML IJ SOLN
INTRAMUSCULAR | Status: DC | PRN
  Administered 2019-09-21: 5 mg via INTRAVENOUS

## 2019-09-21 MED ORDER — SODIUM CHLORIDE 0.9 % IV SOLN: 500.0000 mL | Freq: Once | INTRAVENOUS | Status: DC | PRN

## 2019-09-21 MED ORDER — ACETAMINOPHEN 160 MG/5ML PO SOLN
325.0000 mg | ORAL | Status: DC | PRN
  Filled 2019-09-21: qty 20.3

## 2019-09-21 MED ORDER — CLOPIDOGREL BISULFATE 75 MG PO TABS
75.0000 mg | ORAL_TABLET | Freq: Every day | ORAL | Status: DC
  Administered 2019-09-22: 75 mg via ORAL
  Filled 2019-09-21 (×3): qty 1

## 2019-09-21 MED ORDER — MEPERIDINE HCL 50 MG/ML IJ SOLN: 6.2500 mg | INTRAMUSCULAR | Status: DC | PRN

## 2019-09-21 MED ORDER — ACETAMINOPHEN 325 MG PO TABS: 325.0000 mg | ORAL_TABLET | ORAL | Status: DC | PRN

## 2019-09-21 MED ORDER — "VISTASEAL 4 ML SINGLE DOSE KIT "
PACK | CUTANEOUS | Status: DC | PRN
  Administered 2019-09-21: 4 mL via TOPICAL

## 2019-09-21 MED ORDER — CHLORHEXIDINE GLUCONATE CLOTH 2 % EX PADS: 6.0000 | MEDICATED_PAD | Freq: Once | CUTANEOUS | Status: DC

## 2019-09-21 MED ORDER — ONDANSETRON HCL 4 MG/2ML IJ SOLN: 4.0000 mg | Freq: Four times a day (QID) | INTRAMUSCULAR | Status: DC | PRN

## 2019-09-21 MED ORDER — MAGIC MOUTHWASH
15.0000 mL | Freq: Four times a day (QID) | ORAL | Status: DC
  Administered 2019-09-21 – 2019-09-22 (×2): 15 mL via ORAL
  Filled 2019-09-21: qty 20
  Filled 2019-09-21: qty 15
  Filled 2019-09-21: qty 20
  Filled 2019-09-21 (×2): qty 15
  Filled 2019-09-21 (×2): qty 20
  Filled 2019-09-21: qty 15
  Filled 2019-09-21 (×2): qty 20

## 2019-09-21 MED ORDER — LIDOCAINE HCL (CARDIAC) PF 100 MG/5ML IV SOSY
PREFILLED_SYRINGE | INTRAVENOUS | Status: DC | PRN
  Administered 2019-09-21: 100 mg via INTRAVENOUS

## 2019-09-21 MED ORDER — HEPARIN SODIUM (PORCINE) 1000 UNIT/ML IJ SOLN
INTRAMUSCULAR | Status: AC
  Filled 2019-09-21: qty 1

## 2019-09-21 MED ORDER — FENTANYL CITRATE (PF) 100 MCG/2ML IJ SOLN
INTRAMUSCULAR | Status: DC | PRN
  Administered 2019-09-21: 100 ug via INTRAVENOUS

## 2019-09-21 MED ORDER — NITROGLYCERIN IN D5W 200-5 MCG/ML-% IV SOLN
INTRAVENOUS | Status: AC
  Filled 2019-09-21: qty 250

## 2019-09-21 MED ORDER — FAMOTIDINE 20 MG PO TABS
20.0000 mg | ORAL_TABLET | Freq: Once | ORAL | Status: AC
  Administered 2019-09-21: 20 mg via ORAL

## 2019-09-21 MED ORDER — VANCOMYCIN HCL IN DEXTROSE 1-5 GM/200ML-% IV SOLN: 1000.0000 mg | Freq: Two times a day (BID) | INTRAVENOUS | Status: DC

## 2019-09-21 MED ORDER — CLINDAMYCIN PHOSPHATE 300 MG/50ML IV SOLN
INTRAVENOUS | Status: AC
  Filled 2019-09-21: qty 50

## 2019-09-21 MED ORDER — SUGAMMADEX SODIUM 200 MG/2ML IV SOLN
INTRAVENOUS | Status: AC
  Filled 2019-09-21: qty 2

## 2019-09-21 MED ORDER — ROCURONIUM BROMIDE 50 MG/5ML IV SOLN
INTRAVENOUS | Status: AC
  Filled 2019-09-21: qty 2

## 2019-09-21 MED ORDER — PROMETHAZINE HCL 25 MG/ML IJ SOLN: 6.2500 mg | INTRAMUSCULAR | Status: DC | PRN

## 2019-09-21 MED ORDER — CHLORHEXIDINE GLUCONATE CLOTH 2 % EX PADS
6.0000 | MEDICATED_PAD | Freq: Once | CUTANEOUS | Status: AC
  Administered 2019-09-21: 6 via TOPICAL

## 2019-09-21 MED ORDER — POTASSIUM CHLORIDE CRYS ER 20 MEQ PO TBCR: 20.0000 meq | EXTENDED_RELEASE_TABLET | Freq: Every day | ORAL | Status: DC | PRN

## 2019-09-21 MED ORDER — DEXAMETHASONE SODIUM PHOSPHATE 10 MG/ML IJ SOLN
INTRAMUSCULAR | Status: DC | PRN
  Administered 2019-09-21: 10 mg via INTRAVENOUS

## 2019-09-21 MED ORDER — DEXAMETHASONE SODIUM PHOSPHATE 10 MG/ML IJ SOLN
INTRAMUSCULAR | Status: AC
  Filled 2019-09-21: qty 1

## 2019-09-21 MED ORDER — LABETALOL HCL 5 MG/ML IV SOLN: 10.0000 mg | INTRAVENOUS | Status: DC | PRN

## 2019-09-21 MED ORDER — ALUM & MAG HYDROXIDE-SIMETH 200-200-20 MG/5ML PO SUSP: 15.0000 mL | ORAL | Status: DC | PRN

## 2019-09-21 MED ORDER — ACETAMINOPHEN 325 MG RE SUPP
325.0000 mg | RECTAL | Status: DC | PRN
  Filled 2019-09-21: qty 2

## 2019-09-21 MED ORDER — SUGAMMADEX SODIUM 200 MG/2ML IV SOLN
INTRAVENOUS | Status: DC | PRN
  Administered 2019-09-21: 120 mg via INTRAVENOUS

## 2019-09-21 MED ORDER — ATORVASTATIN CALCIUM 80 MG PO TABS
80.0000 mg | ORAL_TABLET | Freq: Every day | ORAL | Status: DC
  Filled 2019-09-21 (×2): qty 1

## 2019-09-21 MED ORDER — ENSURE ENLIVE PO LIQD
237.0000 mL | Freq: Three times a day (TID) | ORAL | Status: DC
  Administered 2019-09-22 (×2): 237 mL via ORAL
  Filled 2019-09-21: qty 237

## 2019-09-21 MED ORDER — SODIUM CHLORIDE 0.9 % IV SOLN: INTRAVENOUS | Status: DC | PRN

## 2019-09-21 MED ORDER — MUPIROCIN 2 % EX OINT
1.0000 "application " | TOPICAL_OINTMENT | Freq: Two times a day (BID) | CUTANEOUS | Status: DC
  Administered 2019-09-21 – 2019-09-22 (×2): 1 via NASAL
  Filled 2019-09-21: qty 22

## 2019-09-21 MED ORDER — REMIFENTANIL HCL 1 MG IV SOLR
INTRAVENOUS | Status: AC
  Filled 2019-09-21: qty 1000

## 2019-09-21 MED ORDER — ONDANSETRON HCL 4 MG/2ML IJ SOLN
INTRAMUSCULAR | Status: AC
  Filled 2019-09-21: qty 4

## 2019-09-21 MED ORDER — MORPHINE SULFATE (PF) 4 MG/ML IV SOLN
INTRAVENOUS | Status: AC
  Administered 2019-09-21: 2 mg via INTRAVENOUS
  Filled 2019-09-21: qty 1

## 2019-09-21 MED ORDER — ACETAMINOPHEN 325 MG PO TABS
325.0000 mg | ORAL_TABLET | ORAL | Status: DC | PRN
  Administered 2019-09-22: 650 mg via ORAL

## 2019-09-21 MED ORDER — LABETALOL HCL 5 MG/ML IV SOLN
INTRAVENOUS | Status: DC | PRN
  Administered 2019-09-21 (×2): 5 mg via INTRAVENOUS

## 2019-09-21 MED ORDER — LABETALOL HCL 5 MG/ML IV SOLN
INTRAVENOUS | Status: AC
  Filled 2019-09-21: qty 4

## 2019-09-21 MED ORDER — FENTANYL CITRATE (PF) 100 MCG/2ML IJ SOLN
INTRAMUSCULAR | Status: AC
  Filled 2019-09-21: qty 2

## 2019-09-21 MED ORDER — FENTANYL CITRATE (PF) 100 MCG/2ML IJ SOLN
25.0000 ug | INTRAMUSCULAR | Status: DC | PRN
  Administered 2019-09-21: 25 ug via INTRAVENOUS
  Administered 2019-09-21: 50 ug via INTRAVENOUS

## 2019-09-21 MED ORDER — FENTANYL CITRATE (PF) 100 MCG/2ML IJ SOLN
INTRAMUSCULAR | Status: AC
  Administered 2019-09-21: 25 ug via INTRAVENOUS
  Filled 2019-09-21: qty 2

## 2019-09-21 MED ORDER — POLYETHYLENE GLYCOL 3350 17 G PO PACK
17.0000 g | PACK | Freq: Every day | ORAL | Status: DC
  Administered 2019-09-22: 09:00:00 17 g via ORAL
  Filled 2019-09-21 (×2): qty 1

## 2019-09-21 MED ORDER — ASPIRIN 81 MG PO CHEW
81.0000 mg | CHEWABLE_TABLET | Freq: Every day | ORAL | Status: DC
  Administered 2019-09-22: 81 mg via ORAL
  Filled 2019-09-21: qty 1

## 2019-09-21 MED ORDER — LIDOCAINE HCL 1 % IJ SOLN
INTRAMUSCULAR | Status: DC | PRN
  Administered 2019-09-21: 10 mL

## 2019-09-21 MED ORDER — DOCUSATE SODIUM 100 MG PO CAPS
100.0000 mg | ORAL_CAPSULE | Freq: Every day | ORAL | Status: DC
  Administered 2019-09-22: 100 mg via ORAL
  Filled 2019-09-21: qty 1

## 2019-09-21 MED ORDER — PROPOFOL 10 MG/ML IV BOLUS
INTRAVENOUS | Status: DC | PRN
  Administered 2019-09-21: 140 mg via INTRAVENOUS

## 2019-09-21 MED ORDER — TRAMADOL HCL 50 MG PO TABS: 50.0000 mg | ORAL_TABLET | Freq: Four times a day (QID) | ORAL | Status: DC | PRN

## 2019-09-21 MED ORDER — GUAIFENESIN-DM 100-10 MG/5ML PO SYRP
15.0000 mL | ORAL_SOLUTION | ORAL | Status: DC | PRN
  Filled 2019-09-21: qty 15

## 2019-09-21 MED ORDER — ADULT MULTIVITAMIN W/MINERALS CH
1.0000 | ORAL_TABLET | Freq: Every day | ORAL | Status: DC
  Administered 2019-09-22: 1 via ORAL
  Filled 2019-09-21 (×2): qty 1

## 2019-09-21 MED ORDER — HEPARIN SODIUM (PORCINE) 1000 UNIT/ML IJ SOLN
INTRAMUSCULAR | Status: DC | PRN
  Administered 2019-09-21: 5000 [IU] via INTRAVENOUS

## 2019-09-21 MED ORDER — SODIUM CHLORIDE 0.9 % IV SOLN
INTRAVENOUS | Status: DC | PRN
  Administered 2019-09-21: 20 ug/min via INTRAVENOUS
  Administered 2019-09-21: 30 ug/min via INTRAVENOUS

## 2019-09-21 SURGICAL SUPPLY — 59 items
APPLICATOR VISTASEAL 35 (MISCELLANEOUS) ×3 IMPLANT
BAG DECANTER FOR FLEXI CONT (MISCELLANEOUS) ×3 IMPLANT
BLADE SURG 15 STRL LF DISP TIS (BLADE) ×1 IMPLANT
BLADE SURG 15 STRL SS (BLADE) ×2
BLADE SURG SZ11 CARB STEEL (BLADE) ×3 IMPLANT
BOOT SUTURE AID YELLOW STND (SUTURE) ×3 IMPLANT
BRUSH SCRUB EZ  4% CHG (MISCELLANEOUS) ×2
BRUSH SCRUB EZ 4% CHG (MISCELLANEOUS) ×1 IMPLANT
CANISTER SUCT 1200ML W/VALVE (MISCELLANEOUS) ×3 IMPLANT
CHLORAPREP W/TINT 26ML (MISCELLANEOUS) ×3 IMPLANT
COVER WAND RF STERILE (DRAPES) ×3 IMPLANT
DERMABOND ADVANCED (GAUZE/BANDAGES/DRESSINGS) ×4
DERMABOND ADVANCED .7 DNX12 (GAUZE/BANDAGES/DRESSINGS) ×2 IMPLANT
DRAPE 3/4 80X56 (DRAPES) IMPLANT
DRAPE INCISE IOBAN 66X45 STRL (DRAPES) ×3 IMPLANT
DRAPE LAPAROTOMY 77X122 PED (DRAPES) ×3 IMPLANT
ELECT CAUTERY BLADE 6.4 (BLADE) ×3 IMPLANT
ELECT REM PT RETURN 9FT ADLT (ELECTROSURGICAL) ×3
ELECTRODE REM PT RTRN 9FT ADLT (ELECTROSURGICAL) ×1 IMPLANT
GLOVE BIO SURGEON STRL SZ7 (GLOVE) ×9 IMPLANT
GLOVE INDICATOR 7.5 STRL GRN (GLOVE) ×3 IMPLANT
GOWN STRL REUS W/ TWL LRG LVL3 (GOWN DISPOSABLE) ×3 IMPLANT
GOWN STRL REUS W/ TWL XL LVL3 (GOWN DISPOSABLE) ×2 IMPLANT
GOWN STRL REUS W/TWL LRG LVL3 (GOWN DISPOSABLE) ×6
GOWN STRL REUS W/TWL XL LVL3 (GOWN DISPOSABLE) ×4
HEMOSTAT SURGICEL 2X3 (HEMOSTASIS) ×3 IMPLANT
IV NS 250ML (IV SOLUTION) ×2
IV NS 250ML BAXH (IV SOLUTION) ×1 IMPLANT
KIT TURNOVER KIT A (KITS) ×3 IMPLANT
LABEL OR SOLS (LABEL) ×3 IMPLANT
LOOP RED MAXI  1X406MM (MISCELLANEOUS) ×4
LOOP VESSEL MAXI 1X406 RED (MISCELLANEOUS) ×2 IMPLANT
LOOP VESSEL MINI 0.8X406 BLUE (MISCELLANEOUS) ×1 IMPLANT
LOOPS BLUE MINI 0.8X406MM (MISCELLANEOUS) ×2
NEEDLE FILTER BLUNT 18X 1/2SAF (NEEDLE) ×2
NEEDLE FILTER BLUNT 18X1 1/2 (NEEDLE) ×1 IMPLANT
NEEDLE HYPO 25X1 1.5 SAFETY (NEEDLE) ×3 IMPLANT
NS IRRIG 500ML POUR BTL (IV SOLUTION) ×3 IMPLANT
PACK BASIN MAJOR ARMC (MISCELLANEOUS) ×3 IMPLANT
PENCIL ELECTRO HAND CTR (MISCELLANEOUS) IMPLANT
SHUNT W TPORT 9FR PRUITT F3 (SHUNT) ×3 IMPLANT
SUT MNCRL 4-0 (SUTURE) ×2
SUT MNCRL 4-0 27XMFL (SUTURE) ×1
SUT PROLENE 6 0 BV (SUTURE) ×18 IMPLANT
SUT PROLENE 7 0 BV 1 (SUTURE) ×3 IMPLANT
SUT SILK 2 0 (SUTURE) ×2
SUT SILK 2-0 18XBRD TIE 12 (SUTURE) ×1 IMPLANT
SUT SILK 3 0 (SUTURE) ×2
SUT SILK 3-0 18XBRD TIE 12 (SUTURE) ×1 IMPLANT
SUT SILK 4 0 (SUTURE) ×2
SUT SILK 4-0 18XBRD TIE 12 (SUTURE) ×1 IMPLANT
SUT VIC AB 3-0 SH 27 (SUTURE) ×4
SUT VIC AB 3-0 SH 27X BRD (SUTURE) ×2 IMPLANT
SUTURE MNCRL 4-0 27XMF (SUTURE) ×1 IMPLANT
SYR 10ML LL (SYRINGE) ×6 IMPLANT
SYR 20ML LL LF (SYRINGE) ×3 IMPLANT
TRAY FOLEY MTR SLVR 16FR STAT (SET/KITS/TRAYS/PACK) ×3 IMPLANT
TUBING CONNECTING 10 (TUBING) IMPLANT
TUBING CONNECTING 10' (TUBING)

## 2019-09-21 NOTE — Transfer of Care (Signed)
Immediate Anesthesia Transfer of Care Note  Patient: Eugene Strong  Procedure(s) Performed: ENDARTERECTOMY CAROTID (Left )  Patient Location: PACU  Anesthesia Type:General  Level of Consciousness: awake  Airway & Oxygen Therapy: Patient Spontanous Breathing and Patient connected to face mask oxygen  Post-op Assessment: Post -op Vital signs reviewed and stable  Post vital signs: stable  Last Vitals:  Vitals Value Taken Time  BP 132/60 09/21/19 1517  Temp 36.3 C 09/21/19 1517  Pulse 72 09/21/19 1518  Resp 12 09/21/19 1518  SpO2 100 % 09/21/19 1518  Vitals shown include unvalidated device data.  Last Pain:  Vitals:   09/21/19 1517  TempSrc: Temporal  PainSc:          Complications: No apparent anesthesia complications

## 2019-09-21 NOTE — OR Nursing (Signed)
Dr. Lucky Cowboy notified of incresaed swelling at incision line, area marked.  He said he had just seen him at 1730 and it was fine with how he looked.  He expected some oozing at the site.  Plavix to be held for tonight, to be given in a.m.

## 2019-09-21 NOTE — Anesthesia Procedure Notes (Signed)
Arterial Line Insertion Start/End1/03/2020 12:34 PM, 09/21/2019 12:37 PM Performed by: Alvin Critchley, MD, Aline Brochure, CRNA  Patient location: Pre-op. Preanesthetic checklist: patient identified, IV checked, site marked, risks and benefits discussed, surgical consent, monitors and equipment checked, pre-op evaluation, timeout performed and anesthesia consent Lidocaine 1% used for infiltration Right, radial was placed Catheter size: 20 Fr Hand hygiene performed  and maximum sterile barriers used   Attempts: 1 Procedure performed without using ultrasound guided technique. Following insertion, dressing applied and Biopatch. Post procedure assessment: normal and unchanged  Patient tolerated the procedure well with no immediate complications. Additional procedure comments: Arterial line placed by Simeon Craft under supervision of [facility] CRNA> .

## 2019-09-21 NOTE — Anesthesia Procedure Notes (Signed)
Procedure Name: Intubation Date/Time: 09/21/2019 12:32 PM Performed by: Aline Brochure, CRNA Pre-anesthesia Checklist: Patient identified, Patient being monitored, Emergency Drugs available and Suction available Patient Re-evaluated:Patient Re-evaluated prior to induction Oxygen Delivery Method: Circle system utilized Preoxygenation: Pre-oxygenation with 100% oxygen Induction Type: IV induction Ventilation: Mask ventilation without difficulty Laryngoscope Size: 3 and McGraph Grade View: Grade I Tube type: Oral Tube size: 7.5 mm Number of attempts: 1 Airway Equipment and Method: Stylet Placement Confirmation: ETT inserted through vocal cords under direct vision,  positive ETCO2 and breath sounds checked- equal and bilateral Secured at: 21 cm Tube secured with: Tape Dental Injury: Teeth and Oropharynx as per pre-operative assessment  Comments: DVL and tube placement done by jeffrey Benton SRNA under supervision of [employer] CRNA>

## 2019-09-21 NOTE — Op Note (Signed)
[facility]   OPERATIVE NOTE  PROCEDURE:   1.  Left carotid endarterectomy   PRE-OPERATIVE DIAGNOSIS: 1.  High grade left carotid stenosis 2.  Recent stroke  POST-OPERATIVE DIAGNOSIS: same as above   SURGEON: Leotis Pain, MD  ASSISTANT(S): Hezzie Bump, PA-C  ANESTHESIA: general  ESTIMATED BLOOD LOSS: 75 cc  FINDING(S): 1.  Extensive left carotid plaque.  SPECIMEN(S):  Carotid plaque (sent to Pathology)  INDICATIONS:   Eugene Strong is a 69 y.o. male who presents with recent stroke and left carotid stenosis of >85%.  I discussed with the patient the risks, benefits, and alternatives to carotid endarterectomy.  I discussed the differences between carotid stenting and carotid endarterectomy. I discussed the procedural details of carotid endarterectomy with the patient.  The patient is aware that the risks of carotid endarterectomy include but are not limited to: bleeding, infection, stroke, myocardial infarction, death, cranial nerve injuries both temporary and permanent, neck hematoma, possible airway compromise, labile blood pressure post-operatively, cerebral hyperperfusion syndrome, and possible need for additional interventions in the future. The patient is aware of the risks and agrees to proceed forward with the procedure. An assistant was present during the procedure to help facilitate the exposure and expedite the procedure.  DESCRIPTION: After full informed written consent was obtained from the patient, the patient was brought back to the operating room and placed supine upon the operating table.  Prior to induction, the patient received IV antibiotics.  After obtaining adequate anesthesia, the patient was placed into a modified beach chair position with a shoulder roll in place and the patient's neck slightly hyperextended and rotated away from the surgical site.  The patient was prepped in the standard fashion for a carotid endarterectomy. The assistant  provided retraction and mobilization to help facilitate exposure and expedite the procedure throughout the entire procedure.  This included following suture, using retractors, and optimizing lighting. I made an incision anterior to the sternocleidomastoid muscle and dissected down through the subcutaneous tissue.  The platysmas was opened with electrocautery.  Then I dissected down to the internal jugular vein and facial vein.  The facial vein is ligated and divided between 2-0 silk ties.  This was dissected posteriorly until I obtained visualization of the common carotid artery.  This was dissected out and then a vessel loop was placed around the common carotid artery.  This was extremely proximal down below the clavicle to the bottom of the extensive thick carotid plaque.  I then dissected in a periadventitial fashion along the common carotid artery up to the bifurcation.  I then identified the external carotid artery and the superior thyroid artery.  I placed a vessel loop around the superior thyroid artery, and I also dissected out the external carotid artery and placed a vessel loop around it. In the process of this dissection, the hypoglossal nerve was identified and protected from harm.  I then dissected out the internal carotid artery until I identified an area in the internal carotid artery clearly above the stenosis.  I dissected slightly distal to this area, and placed a vessel loop around the artery.  This was fairly high up in the extent of the lesion was over 10 cm long.  At this point, we gave the patient 5000 units of intravenous heparin.  After this was allowed to circulate for several minutes, I pulled up control on the vessel loops to clamp the internal carotid artery, external carotid artery, superior thyroid artery, and then the  common carotid artery.  I then made an arteriotomy in the common carotid artery with a 11 blade, and extended the arteriotomy with a Potts scissor down into the common  carotid artery, then I carried the arteriotomy through the bifurcation into the internal carotid artery until I reached an area that was not diseased.  At this point, I took the [place] shunt that previously been prepared and I inserted it into the internal carotid artery first, and then into the common carotid artery taking care to flush and de-air prior to release of control. At this point, I started the endarterectomy in the common carotid artery with a [place]field elevator and carried this dissection down into the common carotid artery circumferentially.  Then I transected the plaque at a segment where it was adherent and transected the plaque with Potts scissors.  I then carried this dissection up into the external carotid artery.  The plaque was extracted by unclamping the external carotid artery and performing an eversion endarterectomy.  The dissection was then carried into the internal carotid artery where a nice feathered end point was created with gentle traction.  I passed the plaque off the field as a specimen. At this point I removed all loose flecks and remaining disease possible.  At this point, I was satisfied that the minimal remaining disease was densely adherent to the wall and wall integrity was intact. The distal endpoint was tacked down with three 7-0 Prolene sutures.  Due to the generous size of the artery and the long length of the arteriotomy which was longer than our typical patches, I elected to perform a primary closure.  I started 6-0 Prolene suture at the distal endpoint and ran one half the length of the arteriotomy.  I started the second 6-0 Prolene at the proximal end point.  The proximal suture line was run approximately one quarter the length of the arteriotomy.  Prior to completing this suture line, I removed the shunt first from the internal carotid artery, from which there was excellent backbleeding, and clamped it.  Then I removed the shunt from the common carotid artery,  from which there was excellent antegrade bleeding, and then clamped it.  At this point, I allowed the external carotid artery to backbleed, which was excellent.  Then I instilled heparinized saline in this patched artery and then completed the patch angioplasty in the usual fashion.  First, I released the clamp on the external carotid artery, then I released it on the common carotid artery.  After waiting a few seconds, I then released it on the internal carotid artery. Several minutes of pressure were held and 6-0 Prolene patch sutures were used as need for hemostasis.  At this point, I placed Surgicel and Vistacel topical hemostatic agents.  There was no more active bleeding in the surgical site.  The sternocleidomastoid space was closed with three interrupted 3-0 Vicryl sutures. I then reapproximated the platysma muscle with a running stitch of 3-0 Vicryl.  The skin was then closed with a running subcuticular 4-0 Monocryl.  The skin was then cleaned, dried and Dermabond was used to reinforce the skin closure.  The patient awakened and was taken to the recovery room in stable condition, following commands and moving all four extremities without any apparent deficits.    COMPLICATIONS: none  CONDITION: stable  Leotis Pain  09/21/2019, 3:04 PM   This note was created with Dragon Medical transcription system. Any errors in dictation are purely unintentional.

## 2019-09-21 NOTE — Anesthesia Preprocedure Evaluation (Signed)
Anesthesia Evaluation  Patient identified by MRN, date of birth, ID band Patient awake    Reviewed: Allergy & Precautions, NPO status , Patient's Chart, lab work & pertinent test results  History of Anesthesia Complications Negative for: history of anesthetic complications  Airway Mallampati: III  TM Distance: >3 FB Neck ROM: Full    Dental no notable dental hx. (+) Teeth Intact, Poor Dentition, Dental Advisory Given  POOR DENTITION THROUGHOUT, THOUGH PATIENT DENIES ANY LOOSE TEETH. MANDIBULAR TORI PRESENT IN SUBLINGUAL SPACE LESIONS OVER TONGUE:   Pulmonary shortness of breath, neg sleep apnea, neg COPD, Current Smoker and Patient abstained from smoking.,  CHRONIC COUGH   Pulmonary exam normal breath sounds clear to auscultation       Cardiovascular Exercise Tolerance: Good METS(-) hypertension(-) CAD and (-) Past MI negative cardio ROS  (-) dysrhythmias  Rhythm:Regular Rate:Normal - Systolic murmurs    Neuro/Psych  Headaches, Anxiety Depression CVA negative psych ROS   GI/Hepatic neg GERD  ,(+)     (-) substance abuse  ,   Endo/Other  diabetes  Renal/GU negative Renal ROS     Musculoskeletal   Abdominal   Peds  Hematology   Anesthesia Other Findings Past Medical History: No date: Anxiety No date: Depression No date: Diabetes mellitus without complication (HCC)     Comment:  recently diagnosed No date: Dyspnea No date: Headache 08/2019: Mouth sores     Comment:  uses Dukes mouthwash to minimize discomfort 09/01/2019: Stroke ([place])     Comment:  legs still remain weak, slurring of speech  Reproductive/Obstetrics                           Anesthesia Physical Anesthesia Plan  ASA: III  Anesthesia Plan: General   Post-op Pain Management:    Induction: Intravenous  PONV Risk Score and Plan: 2 and Ondansetron, Dexamethasone, TIVA and Propofol infusion  Airway Management  Planned: Oral ETT  Additional Equipment: None and Arterial line  Intra-op Plan:   Post-operative Plan: Extubation in OR  Informed Consent: I have reviewed the patients History and Physical, chart, labs and discussed the procedure including the risks, benefits and alternatives for the proposed anesthesia with the patient or authorized representative who has indicated his/her understanding and acceptance.     Dental advisory given  Plan Discussed with: CRNA and Surgeon  Anesthesia Plan Comments: (Discussed risks of anesthesia with patient, including PONV, sore throat, lip/dental damage, blood loss and transfusion. Rare risks discussed as well, such as cardiorespiratory sequelae. Patient understands.)        Anesthesia Quick Evaluation

## 2019-09-21 NOTE — H&P (Signed)
[facility] History & Physical Update  The patient was interviewed and re-examined.  The patient's previous History and Physical has been reviewed and is unchanged.  There is no change in the plan of care. We plan to proceed with the scheduled procedure.  Leotis Pain, MD  09/21/2019, 12:05 PM

## 2019-09-22 ENCOUNTER — Ambulatory Visit: Payer: Self-pay | Admitting: Adult Health

## 2019-09-22 LAB — CBC
HCT: 21.3 % — ABNORMAL LOW (ref 39.0–52.0)
HCT: 24.4 % — ABNORMAL LOW (ref 39.0–52.0)
Hemoglobin: 6.8 g/dL — ABNORMAL LOW (ref 13.0–17.0)
Hemoglobin: 7.7 g/dL — ABNORMAL LOW (ref 13.0–17.0)
MCH: 36 pg — ABNORMAL HIGH (ref 26.0–34.0)
MCH: 36.5 pg — ABNORMAL HIGH (ref 26.0–34.0)
MCHC: 31.9 g/dL (ref 30.0–36.0)
MCHC: 31.6 g/dL (ref 30.0–36.0)
MCV: 112.7 fL — ABNORMAL HIGH (ref 80.0–100.0)
MCV: 115.6 fL — ABNORMAL HIGH (ref 80.0–100.0)
Platelets: 313 10*3/uL (ref 150–400)
Platelets: 396 10*3/uL (ref 150–400)
RBC: 1.89 MIL/uL — ABNORMAL LOW (ref 4.22–5.81)
RBC: 2.11 MIL/uL — ABNORMAL LOW (ref 4.22–5.81)
RDW: 16.4 % — ABNORMAL HIGH (ref 11.5–15.5)
RDW: 16.6 % — ABNORMAL HIGH (ref 11.5–15.5)
WBC: 8.2 10*3/uL (ref 4.0–10.5)
WBC: 9.9 10*3/uL (ref 4.0–10.5)
nRBC: 0 % (ref 0.0–0.2)
nRBC: 0 % (ref 0.0–0.2)

## 2019-09-22 LAB — BASIC METABOLIC PANEL
Anion gap: 7 (ref 5–15)
BUN: 15 mg/dL (ref 8–23)
CO2: 23 mmol/L (ref 22–32)
Calcium: 8 mg/dL — ABNORMAL LOW (ref 8.9–10.3)
Chloride: 109 mmol/L (ref 98–111)
Creatinine, Ser: 0.8 mg/dL (ref 0.61–1.24)
GFR calc Af Amer: >60 mL/min (ref >60)
GFR calc non Af Amer: >60 mL/min (ref >60)
Glucose, Bld: 133 mg/dL — ABNORMAL HIGH (ref 70–99)
Potassium: 4.5 mmol/L (ref 3.5–5.1)
Sodium: 139 mmol/L (ref 135–145)

## 2019-09-22 LAB — GLUCOSE, CAPILLARY
Glucose-Capillary: 102 mg/dL — ABNORMAL HIGH (ref 70–99)
Glucose-Capillary: 82 mg/dL (ref 70–99)

## 2019-09-22 MED ORDER — MAGIC MOUTHWASH: 15.0000 mL | Freq: Four times a day (QID) | ORAL | 8 refills | Status: DC

## 2019-09-22 MED ORDER — INSULIN ASPART 100 UNIT/ML ~~LOC~~ SOLN: 0.0000 [IU] | Freq: Three times a day (TID) | SUBCUTANEOUS | Status: DC

## 2019-09-22 MED ORDER — INSULIN ASPART 100 UNIT/ML ~~LOC~~ SOLN: 0.0000 [IU] | Freq: Every day | SUBCUTANEOUS | Status: DC

## 2019-09-22 MED ORDER — ACETAMINOPHEN 325 MG PO TABS
ORAL_TABLET | ORAL | Status: AC
  Filled 2019-09-22: qty 2

## 2019-09-22 MED ORDER — TRAMADOL HCL 50 MG PO TABS: 50.0000 mg | ORAL_TABLET | Freq: Four times a day (QID) | ORAL | 0 refills | Status: AC | PRN

## 2019-09-22 MED ORDER — MORPHINE SULFATE (PF) 4 MG/ML IV SOLN
INTRAVENOUS | Status: AC
  Filled 2019-09-22: qty 1

## 2019-09-22 MED ORDER — MAGIC MOUTHWASH: 15.0000 mL | Freq: Four times a day (QID) | ORAL | 8 refills | Status: AC

## 2019-09-22 MED ORDER — VANCOMYCIN HCL IN DEXTROSE 1-5 GM/200ML-% IV SOLN
INTRAVENOUS | Status: AC
  Administered 2019-09-22: 1000 mg via INTRAVENOUS
  Filled 2019-09-22: qty 200

## 2019-09-22 MED ORDER — ALBUTEROL SULFATE (2.5 MG/3ML) 0.083% IN NEBU: 2.5000 mg | INHALATION_SOLUTION | Freq: Once | RESPIRATORY_TRACT | Status: DC

## 2019-09-22 NOTE — Discharge Summary (Signed)
[facility]    Discharge Summary  Patient ID:  Eugene Strong MRN: [id number] DOB/AGE: 1951/08/15 69 y.o.  Admit date: 09/21/2019 Discharge date: 09/22/2019 Date of Surgery: 09/21/2019 Surgeon: Surgeon(s): Algernon Huxley, MD  Admission Diagnosis: Carotid stenosis, symptomatic, with infarction [facility]) [I63.239]  Discharge Diagnoses:  Carotid stenosis, symptomatic, with infarction [facility]) [I63.239]  Secondary Diagnoses: Past Medical History:  Diagnosis Date  . Anxiety   . Depression   . Diabetes mellitus without complication ([place])    recently diagnosed  . Dyspnea   . Headache   . Mouth sores 08/2019   uses Dukes mouthwash to minimize discomfort  . Stroke ([place]) 09/01/2019   legs still remain weak, slurring of speech   Procedure (09/21/19): 1.  Left Carotid Endarterectomy   Discharged Condition: Good  HPI / Hospital Course:  Eugene Strong is a 69 y.o. male is S/P Left:  Procedure(s): ENDARTERECTOMY CAROTID Eugene Strong is a 69 y.o. male who presents with recent stroke and left carotid stenosis of >85%. On 09/21/19, the patient underwent:  1.  Left Carotid Endarterectomy   He tolerated the procedure well was transferred to the recovery room without complication.  The patient's night of surgery was unremarkable.  During the patient's brief inpatient stay, his diet was advanced, his Foley was removed, his pain was controlled with the use of p.o. pain medication and he was ambulating independently.  On day of discharge, the patient was afebrile with stable vital signs making good urine.  Extubated: POD # 0  Physical exam:  A&Ox3, NAD Face: Symmetrical, Tongue Midline Neck:   Trachea midline  Incision: With some surrounding ecchymosis.  Minimal swelling.  Intact Dermabond in place. Cardiovascular: Regular rate and rhythm Pulmonary: Clear to auscultation bilaterally Abdomen: Soft, nontender, nondistended positive bowel sounds Extremities: Warm distally  to toes.  Good capillary refill. Neuro: 5/5 Strength: upper / lower, motor/sensory intact.  Labs as below  Complications: None  Consults: None  Significant Diagnostic Studies: CBC Lab Results  Component Value Date   WBC 9.9 09/22/2019   HGB 7.7 (L) 09/22/2019   HCT 24.4 (L) 09/22/2019   MCV 115.6 (H) 09/22/2019   PLT 396 09/22/2019   BMET    Component Value Date/Time   NA 139 09/22/2019 0548   K 4.5 09/22/2019 0548   CL 109 09/22/2019 0548   CO2 23 09/22/2019 0548   GLUCOSE 133 (H) 09/22/2019 0548   BUN 15 09/22/2019 0548   CREATININE 0.80 09/22/2019 0548   CALCIUM 8.0 (L) 09/22/2019 0548   GFRNONAA >60 09/22/2019 0548   GFRAA >60 09/22/2019 0548   COAG Lab Results  Component Value Date   INR 0.9 09/19/2019   INR 0.9 09/01/2019   Disposition:  Discharge to :Home  Allergies as of 09/22/2019      Reactions   Codeine Rash   Penicillins Rash      Medication List    TAKE these medications   aspirin 81 MG EC tablet Take 1 tablet (81 mg total) by mouth daily.   atorvastatin 80 MG tablet Commonly known as: LIPITOR Take 1 tablet (80 mg total) by mouth daily at 6 PM.   benzocaine 10 % mucosal gel Commonly known as: ORAJEL Use as directed in the mouth or throat 4 (four) times daily.   clopidogrel 75 MG tablet Commonly known as: PLAVIX Take 1 tablet (75 mg total) by mouth daily.   feeding supplement (ENSURE ENLIVE) Liqd Take 237 mLs by mouth 3 (  three) times daily with meals.   magic mouthwash Soln Take 15 mLs by mouth 4 (four) times daily.   metFORMIN 500 MG tablet Commonly known as: Glucophage Take 1 tablet (500 mg total) by mouth 2 (two) times daily with a meal.   multivitamin with minerals Tabs tablet Take 1 tablet by mouth daily.   polyethylene glycol 17 g packet Commonly known as: MIRALAX / GLYCOLAX Take 17 g by mouth daily.   traMADol 50 MG tablet Commonly known as: ULTRAM Take 1 tablet (50 mg total) by mouth every 6 (six) hours as needed  for up to 7 days for moderate pain.      Verbal and written Discharge instructions given to the patient. Wound care per Discharge AVS Follow-up Information    Kris Hartmann, NP Follow up in 2 week(s).   Specialty: Vascular Surgery Contact information: [place] [address] [phone]          Signed: Sela Hua, PA-C  09/22/2019, 11:24 AM

## 2019-09-22 NOTE — Anesthesia Postprocedure Evaluation (Signed)
Anesthesia Post Note  Patient: Eugene Strong  Procedure(s) Performed: ENDARTERECTOMY CAROTID (Left )  Patient location during evaluation: PACU Anesthesia Type: General Level of consciousness: awake and alert Pain management: pain level controlled Vital Signs Assessment: post-procedure vital signs reviewed and stable Respiratory status: spontaneous breathing, nonlabored ventilation, respiratory function stable and patient connected to nasal cannula oxygen Cardiovascular status: blood pressure returned to baseline and stable Postop Assessment: no apparent nausea or vomiting Anesthetic complications: no     Last Vitals:  Vitals:   09/22/19 0500 09/22/19 0700  BP: (!) 145/65 (!) 155/65  Pulse: 79 87  Resp:    Temp:  36.8 C  SpO2: 99% 100%    Last Pain:  Vitals:   09/22/19 0714  TempSrc:   PainSc: 2                  Martha Clan

## 2019-09-22 NOTE — Progress Notes (Signed)
Nsg Discharge Note  Admit Date:  09/21/2019 Discharge date: 09/22/2019   Eugene Strong to be D/C'd Home per MD order.  AVS completed.  Copy for chart, and copy for patient signed, and dated. Patient/caregiver able to verbalize understanding.  Discharge Medication: Allergies as of 09/22/2019      Reactions   Codeine Rash   Penicillins Rash      Medication List    TAKE these medications   aspirin 81 MG EC tablet Take 1 tablet (81 mg total) by mouth daily.   atorvastatin 80 MG tablet Commonly known as: LIPITOR Take 1 tablet (80 mg total) by mouth daily at 6 PM.   benzocaine 10 % mucosal gel Commonly known as: ORAJEL Use as directed in the mouth or throat 4 (four) times daily.   clopidogrel 75 MG tablet Commonly known as: PLAVIX Take 1 tablet (75 mg total) by mouth daily.   feeding supplement (ENSURE ENLIVE) Liqd Take 237 mLs by mouth 3 (three) times daily with meals.   magic mouthwash Soln Take 15 mLs by mouth 4 (four) times daily.   metFORMIN 500 MG tablet Commonly known as: Glucophage Take 1 tablet (500 mg total) by mouth 2 (two) times daily with a meal.   multivitamin with minerals Tabs tablet Take 1 tablet by mouth daily.   polyethylene glycol 17 g packet Commonly known as: MIRALAX / GLYCOLAX Take 17 g by mouth daily.   traMADol 50 MG tablet Commonly known as: ULTRAM Take 1 tablet (50 mg total) by mouth every 6 (six) hours as needed for up to 7 days for moderate pain.       Discharge Assessment: Vitals:   09/22/19 1225 09/22/19 1319  BP: (!) 140/48 (!) 153/56  Pulse:  88  Resp: 18 18  Temp: (!) 96.7 F (35.9 C)   SpO2:  92%   Skin clean, dry and intact without evidence of skin break down, no evidence of skin tears noted. IV catheter discontinued intact. Site without signs and symptoms of complications - no redness or edema noted at insertion site, patient denies c/o pain - only slight tenderness at site.  Dressing with slight pressure applied.  D/c  Instructions-Education: Discharge instructions given to patient/family with verbalized understanding. D/c education completed with patient/family including follow up instructions, medication list, d/c activities limitations if indicated, with other d/c instructions as indicated by MD - patient able to verbalize understanding, all questions fully answered. Patient instructed to return to ED, call 911, or call MD for any changes in condition.  Patient escorted via [place], and D/C home via private auto.  Tresa Endo, RN 09/22/2019 1:36 PM

## 2019-09-25 LAB — SURGICAL PATHOLOGY

## 2019-10-06 ENCOUNTER — Encounter (INDEPENDENT_AMBULATORY_CARE_PROVIDER_SITE_OTHER): Payer: Self-pay | Admitting: Nurse Practitioner

## 2019-10-06 ENCOUNTER — Other Ambulatory Visit: Payer: Self-pay

## 2019-10-06 ENCOUNTER — Ambulatory Visit (INDEPENDENT_AMBULATORY_CARE_PROVIDER_SITE_OTHER): Payer: Medicare Other | Admitting: Nurse Practitioner

## 2019-10-06 ENCOUNTER — Other Ambulatory Visit (INDEPENDENT_AMBULATORY_CARE_PROVIDER_SITE_OTHER): Payer: Self-pay | Admitting: Vascular Surgery

## 2019-10-06 VITALS — BP 131/85 | HR 103 | Resp 103 | Ht 74.0 in | Wt 103.0 lb

## 2019-10-06 DIAGNOSIS — I10 Essential (primary) hypertension: Secondary | ICD-10-CM

## 2019-10-06 DIAGNOSIS — I63239 Cerebral infarction due to unspecified occlusion or stenosis of unspecified carotid arteries: Secondary | ICD-10-CM

## 2019-10-10 DIAGNOSIS — Z8673 Personal history of transient ischemic attack (TIA), and cerebral infarction without residual deficits: Secondary | ICD-10-CM | POA: Insufficient documentation

## 2019-10-10 DIAGNOSIS — R531 Weakness: Secondary | ICD-10-CM | POA: Insufficient documentation

## 2019-10-10 DIAGNOSIS — R4781 Slurred speech: Secondary | ICD-10-CM | POA: Insufficient documentation

## 2019-10-10 DIAGNOSIS — R2689 Other abnormalities of gait and mobility: Secondary | ICD-10-CM | POA: Insufficient documentation

## 2019-10-11 ENCOUNTER — Encounter (INDEPENDENT_AMBULATORY_CARE_PROVIDER_SITE_OTHER): Payer: Self-pay | Admitting: Nurse Practitioner

## 2019-10-11 ENCOUNTER — Telehealth: Payer: Self-pay | Admitting: Adult Health

## 2019-10-11 ENCOUNTER — Ambulatory Visit (INDEPENDENT_AMBULATORY_CARE_PROVIDER_SITE_OTHER): Payer: Medicare Other | Admitting: Adult Health

## 2019-10-11 ENCOUNTER — Encounter: Payer: Self-pay | Admitting: Adult Health

## 2019-10-11 ENCOUNTER — Other Ambulatory Visit: Payer: Self-pay

## 2019-10-11 VITALS — BP 122/66 | HR 98 | Temp 96.2°F | Resp 16 | Ht 74.0 in | Wt 103.2 lb

## 2019-10-11 DIAGNOSIS — D649 Anemia, unspecified: Secondary | ICD-10-CM

## 2019-10-11 DIAGNOSIS — I6340 Cerebral infarction due to embolism of unspecified cerebral artery: Secondary | ICD-10-CM | POA: Diagnosis not present

## 2019-10-11 DIAGNOSIS — R718 Other abnormality of red blood cells: Secondary | ICD-10-CM

## 2019-10-11 DIAGNOSIS — G8929 Other chronic pain: Secondary | ICD-10-CM

## 2019-10-11 DIAGNOSIS — E440 Moderate protein-calorie malnutrition: Secondary | ICD-10-CM

## 2019-10-11 DIAGNOSIS — Z87898 Personal history of other specified conditions: Secondary | ICD-10-CM

## 2019-10-11 DIAGNOSIS — R040 Epistaxis: Secondary | ICD-10-CM

## 2019-10-11 DIAGNOSIS — R64 Cachexia: Secondary | ICD-10-CM

## 2019-10-11 DIAGNOSIS — I10 Essential (primary) hypertension: Secondary | ICD-10-CM

## 2019-10-11 DIAGNOSIS — Z9889 Other specified postprocedural states: Secondary | ICD-10-CM

## 2019-10-11 DIAGNOSIS — Z681 Body mass index (BMI) 19 or less, adult: Secondary | ICD-10-CM

## 2019-10-11 DIAGNOSIS — R350 Frequency of micturition: Secondary | ICD-10-CM

## 2019-10-11 DIAGNOSIS — J3489 Other specified disorders of nose and nasal sinuses: Secondary | ICD-10-CM

## 2019-10-11 DIAGNOSIS — F17200 Nicotine dependence, unspecified, uncomplicated: Secondary | ICD-10-CM

## 2019-10-11 DIAGNOSIS — R4781 Slurred speech: Secondary | ICD-10-CM

## 2019-10-11 DIAGNOSIS — M270 Developmental disorders of jaws: Secondary | ICD-10-CM

## 2019-10-11 DIAGNOSIS — Z8673 Personal history of transient ischemic attack (TIA), and cerebral infarction without residual deficits: Secondary | ICD-10-CM

## 2019-10-11 DIAGNOSIS — K089 Disorder of teeth and supporting structures, unspecified: Secondary | ICD-10-CM

## 2019-10-11 DIAGNOSIS — J342 Deviated nasal septum: Secondary | ICD-10-CM

## 2019-10-11 DIAGNOSIS — R531 Weakness: Secondary | ICD-10-CM

## 2019-10-11 DIAGNOSIS — K5909 Other constipation: Secondary | ICD-10-CM

## 2019-10-11 NOTE — Progress Notes (Signed)
SUBJECTIVE:  Patient ID: Eugene Strong, male    DOB: 07/13/1951, 69 y.o.   MRN: [id number] Chief Complaint  Patient presents with  . Follow-up  . Carotid    HPI  Eugene Strong is a 69 y.o. male presents today after left carotid endarterectomy on 09/21/2019.  There were no post operative problems or complications related to the surgery.  The patient has some minor incisional pain.  The patient denies interval amaurosis fugax. There is no recent history of TIA symptoms or focal motor deficits.  Patient still has some residual weakness and slurred speech from prior CVA..  The patient denies headache.  The patient is taking enteric-coated aspirin 81 mg daily.  The patient has a history of coronary artery disease, no recent episodes of angina or shortness of breath. The patient denies PAD or claudication symptoms. There is a history of hyperlipidemia which is being treated with a statin.    Past Medical History:  Diagnosis Date  . Anxiety   . Depression   . Diabetes mellitus without complication ([place])    recently diagnosed  . Dyspnea   . Headache   . Mouth sores 08/2019   uses [facility]ukes mouthwash to minimize discomfort  . Stroke ([place]) 09/01/2019   legs still remain weak, slurring of speech    Past Surgical History:  Procedure Laterality Date  . ENDARTERECTOMY Left 09/21/2019   Procedure: ENDARTERECTOMY CAROTID;  Surgeon: Algernon Huxley, MD;  Location: [facility] ORS;  Service: Vascular;  Laterality: Left;  . SURGERY SCROTAL / TESTICULAR  1961    Social History   Socioeconomic History  . Marital status: Single    Spouse name: Not on file  . Number of children: Not on file  . Years of education: Not on file  . Highest education level: Not on file  Occupational History  . 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 Smoking status: Current Every Day Smoker    Packs/day: 0.25  . Smokeless tobacco: Never Used  Substance and Sexual Activity  . Alcohol use:  Not Currently  . Drug use: Never  . Sexual activity: Not Currently  Other Topics Concern  . Not on file  Social History Narrative   Lives alone. Sister lives nearby. Patient has been staying with her since stroke 08/2019.    He will return to her house after his surgery.   Social Determinants of Health   Financial Resource Strain:   . Difficulty of Paying Living Expenses: Not on file  Food Insecurity:   . Worried About Charity fundraiser in the Last Year: Not on file  . Ran Out of Food in the Last Year: Not on file  Transportation Needs:   . Lack of Transportation (Medical): Not on file  . Lack of Transportation (Non-Medical): Not on file  Physical Activity:   . Days of Exercise per Week: Not on file  . Minutes of Exercise per Session: Not on file  Stress:   . Feeling of Stress : Not on file  Social Connections:   . Frequency of Communication with Friends and Family: Not on file  . Frequency of Social Gatherings with Friends and Family: Not on file  . Attends Religious Services: Not on file  . Active Member of Clubs or Organizations: Not on file  . Attends Archivist Meetings: Not on file  . Marital Status: Not on file  Intimate Partner Violence:   . Fear of  Current or Ex-Partner: Not on file  . Emotionally Abused: Not on file  . Physically Abused: Not on file  . Sexually Abused: Not on file    Family History  Problem Relation Age of Onset  . Goiter Father     Allergies  Allergen Reactions  . Codeine Rash  . Penicillins Rash     Review of Systems   Review of Systems: Negative Unless Checked Constitutional: [] Weight loss  [] Fever  [] Chills Cardiac: [] Chest pain   []  Atrial Fibrillation  [] Palpitations   [] Shortness of breath when laying flat   [] Shortness of breath with exertion. [] Shortness of breath at rest Vascular:  [] Pain in legs with walking   [] Pain in legs with standing [] Pain in legs when laying flat   [] Claudication    [] Pain in feet when  laying flat    [] History of DVT   [] Phlebitis   [] Swelling in legs   [] Varicose veins   [] Non-healing ulcers Pulmonary:   [] Uses home oxygen   [] Productive cough   [] Hemoptysis   [] Wheeze  [] COPD   [] Asthma Neurologic:  [] Dizziness   [] Seizures  [] Blackouts [x] History of stroke   [] History of TIA  [] Aphasia   [] Temporary Blindness   [] Weakness or numbness in arm   [x] Weakness or numbness in leg Musculoskeletal:   [] Joint swelling   [] Joint pain   [] Low back pain  []  History of Knee Replacement [] Arthritis [] back Surgeries  []  Spinal Stenosis    Hematologic:  [] Easy bruising  [] Easy bleeding   [] Hypercoagulable state   [] Anemic Gastrointestinal:  [] Diarrhea   [] Vomiting  [] Gastroesophageal reflux/heartburn   [] Difficulty swallowing. [] Abdominal pain Genitourinary:  [] Chronic kidney disease   [] Difficult urination  [] Anuric   [] Blood in urine [] Frequent urination  [] Burning with urination   [] Hematuria Skin:  [] Rashes   [] Ulcers [] Wounds Psychological:  [] History of anxiety   []  History of major depression  []  Memory Difficulties      OBJECTIVE:   Physical Exam  BP 131/85 (BP Location: Right Arm)   Pulse (!) 103   Resp (!) 103   Ht 6\' 2"  (1.88 m)   Wt 103 lb (46.7 kg)   BMI 13.22 kg/m   Gen: WD/WN, NAD Head: [place]/AT, No temporalis wasting.  Ear/Nose/Throat: Hearing grossly intact, nares w/o erythema or drainage Eyes: PER, EOMI, sclera nonicteric.  Neck: Supple, no masses.  No JVD.  Pulmonary:  Good air movement, no use of accessory muscles.  Cardiac: RRR Vascular:  Well approximated, clean dry and intact incision Vessel Right Left  Radial Palpable Palpable   Gastrointestinal: soft, non-distended. No guarding/no peritoneal signs.  Musculoskeletal: Residual weakness and balance issues following stroke. No deformity or atrophy.  Neurologic: Pain and light touch intact in extremities.  Symmetrical.  Speech is fluent. Motor exam as listed above. Psychiatric: Judgment intact, Mood & affect  appropriate for pt's clinical situation. Dermatologic: No Venous rashes. No Ulcers Noted.  No changes consistent with cellulitis. Lymph : No Cervical lymphadenopathy, no lichenification or skin changes of chronic lymphedema.       ASSESSMENT AND PLAN:  1. Carotid stenosis, symptomatic, with infarction (HCC) Clean dry and intact incision.  Minimal bruising.  Appears to be healing very well overall.  Patient still having some occasional pain and we will authorize a one-time refill.  Patient will return in 4 to 6 weeks for noninvasive studies to evaluate carotid artery stenosis.  2. Essential hypertension Continue antihypertensive medications as already ordered, these medications have been reviewed and there are  no changes at this time.    Current Outpatient Medications on File Prior to Visit  Medication Sig Dispense Refill  . aspirin EC 81 MG EC tablet Take 1 tablet (81 mg total) by mouth daily. 150 tablet 0  . atorvastatin (LIPITOR) 80 MG tablet Take 1 tablet (80 mg total) by mouth daily at 6 PM. 30 tablet 0  . benzocaine (ORAJEL) 10 % mucosal gel Use as directed in the mouth or throat 4 (four) times daily. 5.3 g 0  . clopidogrel (PLAVIX) 75 MG tablet Take 1 tablet (75 mg total) by mouth daily. 30 tablet 0  . feeding supplement, ENSURE ENLIVE, (ENSURE ENLIVE) LIQD Take 237 mLs by mouth 3 (three) times daily with meals. 237 mL 12  . magic mouthwash SOLN Take 15 mLs by mouth 4 (four) times daily. 600 mL 8  . metFORMIN (GLUCOPHAGE) 500 MG tablet Take 1 tablet (500 mg total) by mouth 2 (two) times daily with a meal. 60 tablet 0  . Multiple Vitamin (MULTIVITAMIN WITH MINERALS) TABS tablet Take 1 tablet by mouth daily. 150 tablet 0  . polyethylene glycol (MIRALAX / GLYCOLAX) 17 g packet Take 17 g by mouth daily. 14 each 0   No current facility-administered medications on file prior to visit.    There are no Patient Instructions on file for this visit. No follow-ups on file.   Kris Hartmann, NP  This note was completed with Sales executive.  Any errors are purely unintentional.

## 2019-10-11 NOTE — Telephone Encounter (Signed)
Home health nurse was advised as below. KW

## 2019-10-11 NOTE — Telephone Encounter (Signed)
Absolutely ok for speech therapy verbal order. [facility] for PT order too if you speak with them. He needs both.

## 2019-10-11 NOTE — Telephone Encounter (Signed)
Deirdre with [facility] [phone]  Pt's Speech Therapy discharge was omitted.  Needing verbal orders for Speech Therapy:  2 times a week for 1 week 1 time a week for 1 week  Thanks, [place]

## 2019-10-11 NOTE — Progress Notes (Signed)
Patient: Eugene Strong, Male    DOB: July 27, 1951, 69 y.o.   MRN: [id number] Visit Date: 10/11/2019  Today's Provider: Marcille Buffy, FNP   Chief Complaint  Patient presents with  . New Patient (Initial Visit)   Subjective:    New Patient Eugene Strong is a 69 y.o. male who presents today for health maintenance and establish care as a new patient. He feels well. He reports exercising . He reports he is sleeping fairly well, patient reports that he gets up twice in the evening due to enlarged prostate. Marland Kitchen  He was seen in the hospital on 09/01/2019 for left-sided weakness, he does have a history of a former stroke and had not seen a doctor in 39 years.  Patient came to the hospital and reported that he had been having left-sided weakness and had a fall without any significant injuries over a week prior and did not seek medical care.  He was also having some slurred speech at the time.  Patient was also hypoglycemic with a blood sugar in the 30s at the time he was admitted to the hospital.Swallowing test was performed 09/04/2019.   IMPRESSION: MRI HEAD IMPRESSION:  1. Few tiny subcentimeter acute to early subacute cortical infarcts involving the left parietal and occipital lobes as above. No associated hemorrhage. 2. Underlying age-related cerebral atrophy with moderate chronic microvascular ischemic disease. 3. 8 mm nodule involving the skin of the left nasal bridge, indeterminate. Correlation with physical exam recommended.  MRA HEAD IMPRESSION:  1. No large vessel occlusion. 2. 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 No other proximal high-grade or correctable stenosis.   Electronically Signed   By: Jeannine Boga M.D.   On: 09/01/2019 22:01 CT reviewed as well 09/01/2019: Sinuses/Orbits: Globes and orbital soft tissues within normal limits. Paranasal sinuses are largely clear.  Right-to-left nasal septal deviation with associated concha bullosa noted. 8 mm well-circumscribed T2 hyperintense nodule at the left nasal bridge, indeterminate (series 15, image 7).    He has [facility] for physical therapy doing 2 x week. Speech therapist, physocal therapist, was doing occupational but that was stopped. He is able to feed himself.  He reports increased weakness since his stroke.  He was diagnosed with stroke. Some issues with swallowing. Denies any choking.  ----------------------------------------------------------------- He had a an appointment on 10/09/2019. 10/09/2019 Initial consult [facility]  [place], [place] [address]  [phone]  Vickki Hearing, [place]  [facility]  [place], [place] [address]  [phone]  [phone] (Fax)  History of stroke (Primary Dx);  Weakness generalized;  Slurred speech;  Imbalance   Social History - documented as of this encounter  Last hemoglobin 7.7  HCT 24.4 09/22/2019 post Left carotid enterectomy.  Previous 2- 3 weeks ago  Hemoglobin 6.8/ 10.6 and Hct 21.3 and 32.0 RDW elevated MCV elevated 115.6  MCH slight elevation 36.5  [place] bilateral carotid.  MPRESSION: 1. Significant estimated greater than 70% proximal left ICA stenosis. 2. Estimated less than 50% proximal right ICA stenosis. Electronically Signed   By: Aletta Edouard M.D.   On: 09/02/2019 13:40 Left Carotid enterectomy on the 8th of January  Dr. Lucky Cowboy performed, he has follow up with NP Owens Shark February 19th.   09/01/19 CT MAXILLOFACIAL WO CONTRAST IMPRESSION: 1. No acute facial bone fracture. 2. Remaining mandibular dentition with small periapical lucencies. No cortical destruction  or periostitis. No focal soft tissue swelling or fluid collection. 3. 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 Right-sided lesion measures up to 2.5 cm with encroachment on the floor of mouth and superior deviation of the Tongue.  He is taking Ensure and supplements. He eats fair he reports, He has dental issues that cause pain with chewing. [facility] to discuss dentures. He has bitten his tongue multiple times he reports.  He saew dentist yesterday. Weight loss 50-60 lbs since Jan 2020 and he feels this is due to pain with eating due to poor dentition. He does also report strong familiy history for thyroid problems, he denies any thyroid problems in the past.   Denies any hemoptysis. He  does have occasional light bleeding from his nose that stopped easily, he reports that it only occurs seasonally when his nasal patches tubes are dry.  Mostlly occurs left nasal passage.   He smokes 5 cigarettes a day. Smoker since 67. He reports always was light smoker in past.  He reports couple beers a week. Denies any other alcohol use.  Denies any other drug use. Used marijuana many many years ago and quit years ago.  He denies any cough, shortness of breath or back pain.   He has never had a colonoscopy. . Occasionally has constipation, he denies any nausea vomiting or diarrhea.Denies any rectal pain or pressure, denies bleeding from rectum or blood in stools, or dark colored stools. He does not think he wants a colonoscopy but is in agreement to establish care with gastroenterology.   Study Result  CLINICAL DATA:  Constipation, intermittent constipation. EXAM: PORTABLE ABDOMEN - 1 VIEW COMPARISON:  None. FINDINGS: Lung bases are clear.  No signs of free air on supine radiography. Scattered gas-filled loops of small bowel with mild distension to moderate distension. Gas and stool in the rectum and scattered loops of gas-filled colon. No acute bone finding. IMPRESSION: Findings most suggestive of global ileus. Early partial small  bowel obstruction might have a similar appearance, consider continued follow-up for further assessment. Electronically Signed   By: Zetta Bills M.D.   On: 09/02/2019 10:34    IMPRESSION: 1. Critical proximal left ICA stenosis with short segment string sign. 2. 45% proximal right ICA atheromatous stenosis. 3. High-grade narrowing at both vertebral origins with occluded V4 segment on the non dominant right side. 4. 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 Electronically Signed   By: Monte Fantasia M.D.   On: 09/03/2019 12:39  He has a history of enlarged prostate he reports  for around  5 years. He reports getting up a couple of times at night.Denies any dysuria.  Denies any blood in urine. He reports he has not seen anyone for this condition.   Denies any chest pain shortness of breath.   Patient  denies any fever, body aches,chills, rash, chest pain, shortness of breath, nausea, vomiting, or diarrhea.   Review of Systems  Constitutional: Positive for appetite change, fatigue and unexpected weight change. Negative for activity change, chills, diaphoresis and fever.  HENT: Positive for dental problem, drooling, nosebleeds and rhinorrhea. Negative for congestion and ear discharge.   Eyes: Negative.   Respiratory: Negative.   Cardiovascular: Negative.   Gastrointestinal: Positive for constipation. Negative for abdominal distention, abdominal pain, anal bleeding, blood in stool, diarrhea, nausea, rectal pain and vomiting.  Endocrine: Negative for  cold intolerance, heat intolerance, polydipsia, polyphagia and polyuria.  Genitourinary: Positive for urgency. Negative for decreased urine volume, difficulty urinating, discharge, dysuria, enuresis, flank pain, frequency, genital sores, hematuria, penile pain, penile swelling, scrotal swelling and testicular pain.  Musculoskeletal: Positive for arthralgias and gait problem (weakness at times from stroke ). Negative for  back pain, joint swelling, myalgias, neck pain and neck stiffness.  Skin: Positive for color change (bruising ).  Neurological: Positive for weakness and numbness. Negative for dizziness, tremors, seizures, syncope, facial asymmetry, speech difficulty, light-headedness and headaches.  Hematological: Negative for adenopathy. Bruises/bleeds easily (bruises ).  Psychiatric/Behavioral: Negative.   All other systems reviewed and are negative.   Social History He  reports that he has been smoking. He has been smoking about 0.25 packs per day. He has never used smokeless tobacco. He reports previous alcohol use. He reports that he does not use drugs. Social History   Socioeconomic History  . Marital status: Single    Spouse name: Not on file  . Number of children: Not on file  . Years of education: Not on file  . Highest education level: Not on file  Occupational History  . 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 Smoking status: Current Every Day Smoker    Packs/day: 0.25  . Smokeless tobacco: Never Used  Substance and Sexual Activity  . Alcohol use: Not Currently  . Drug use: Never  . Sexual activity: Not Currently  Other Topics Concern  . Not on file  Social History Narrative   Lives alone. Sister lives nearby. Patient has been staying with her since stroke 08/2019.    He will return to her house after his surgery.   Social Determinants of Health   Financial Resource Strain:   . Difficulty of Paying Living Expenses: Not on file  Food Insecurity:   . Worried About Charity fundraiser in the Last Year: Not on file  . Ran Out of Food in the Last Year: Not on file  Transportation Needs:   . Lack of Transportation (Medical): Not on file  . Lack of Transportation (Non-Medical): Not on file  Physical Activity:   . Days of Exercise per Week: Not on file  . Minutes of Exercise per Session: Not on file  Stress:   . Feeling of Stress : Not on file  Social  Connections:   . Frequency of Communication with Friends and Family: Not on file  . Frequency of Social Gatherings with Friends and Family: Not on file  . Attends Religious Services: Not on file  . Active Member of Clubs or Organizations: Not on file  . Attends Archivist Meetings: Not on file  . Marital Status: Not on file    Patient Active Problem List   Diagnosis Date Noted  . History of stroke 10/10/2019  . Imbalance 10/10/2019  . Slurred speech 10/10/2019  . Weakness generalized 10/10/2019  . Carotid stenosis, symptomatic, with infarction ([place]) 09/21/2019  . Protein-calorie malnutrition, severe 09/03/2019  . HTN (hypertension) 09/01/2019  . Protein-calorie malnutrition, moderate ([place]) 09/01/2019  . Stroke ([place])   . Hypoglycemia     Past Surgical History:  Procedure Laterality Date  . ENDARTERECTOMY Left 09/21/2019   Procedure: ENDARTERECTOMY CAROTID;  Surgeon: Algernon Huxley, MD;  Location: [facility] ORS;  Service: Vascular;  Laterality: Left;  . SURGERY SCROTAL / TESTICULAR  1961    Family History  Family Status  Relation Name Status  .  Father  (Not Specified)   His family history includes Goiter in his father.     Allergies  Allergen Reactions  . Codeine Rash  . Penicillins Rash    Previous Medications   ASPIRIN EC 81 MG EC TABLET    Take 1 tablet (81 mg total) by mouth daily.   ATORVASTATIN (LIPITOR) 80 MG TABLET    Take 1 tablet (80 mg total) by mouth daily at 6 PM.   BENZOCAINE (ORAJEL) 10 % MUCOSAL GEL    Use as directed in the mouth or throat 4 (four) times daily.   CLOPIDOGREL (PLAVIX) 75 MG TABLET    Take 1 tablet (75 mg total) by mouth daily.   FEEDING SUPPLEMENT, ENSURE ENLIVE, (ENSURE ENLIVE) LIQD    Take 237 mLs by mouth 3 (three) times daily with meals.   MAGIC MOUTHWASH SOLN    Take 15 mLs by mouth 4 (four) times daily.   METFORMIN (GLUCOPHAGE) 500 MG TABLET    Take 1 tablet (500 mg total) by mouth 2 (two) times daily with a meal.   MULTIPLE  VITAMIN (MULTIVITAMIN WITH MINERALS) TABS TABLET    Take 1 tablet by mouth daily.   MULTIPLE VITAMINS-MINERALS (MY-VITALIFE) CAPS    Take by mouth.   POLYETHYLENE GLYCOL (MIRALAX / GLYCOLAX) 17 G PACKET    Take 17 g by mouth daily.    Patient Care Team: Doreen Beam, FNP as PCP - General (Family Medicine)      Objective:   Vitals: BP 122/66   Pulse 98   Temp (!) 96.2 F (35.7 C) (Oral)   Resp 16   Ht 6\' 2"  (1.88 m)   Wt 103 lb 3.2 oz (46.8 kg)   SpO2 95%   BMI 13.25 kg/m    Physical Exam Vitals reviewed.  Constitutional:      General: He is not in acute distress.    Appearance: He is ill-appearing (thin and cachetic in apperance, pale skin ). He is not toxic-appearing or diaphoretic.  HENT:     Head: Normocephalic and atraumatic.     Jaw: There is normal jaw occlusion.     Right Ear: Hearing, tympanic membrane, ear canal and external ear normal.     Left Ear: Hearing, tympanic membrane, ear canal and external ear normal.     Nose: Mucosal edema present. No congestion or rhinorrhea.     Mouth/Throat:     Lips: Pink.     Mouth: Mucous membranes are moist.     Dentition: Abnormal dentition. Dental tenderness, gingival swelling and dental caries present.     Tongue: Lesions present.     Palate: Mass present.     Pharynx: Oropharynx is clear. Uvula midline.     Comments: Torus mandibular bony outgrowth from the inner aspect of the anterior mandible bilaterally, right greater than left mandibular.  Right-sided lesion  on the floor of mouth and superior deviation of the Tongue. Teeth alsp appear loose.   Eyes:     General: No scleral icterus.       Right eye: No discharge.        Left eye: No discharge.     Extraocular Movements: Extraocular movements intact.     Conjunctiva/sclera: Conjunctivae normal.     Pupils: Pupils are equal, round, and reactive to light.  Cardiovascular:     Rate and Rhythm: Normal rate and regular rhythm.     Heart sounds: Normal heart  sounds.  Pulmonary:     Effort:  Pulmonary effort is normal. No respiratory distress.     Breath sounds: Normal breath sounds. No stridor. No wheezing, rhonchi or rales.  Chest:     Chest wall: No tenderness.  Abdominal:     General: Abdomen is flat. Bowel sounds are normal. There is no distension or abdominal bruit. There are no signs of injury.     Palpations: There is no shifting dullness, fluid wave, hepatomegaly, splenomegaly, mass or pulsatile mass.     Tenderness: There is no abdominal tenderness. There is no right CVA tenderness, left CVA tenderness, guarding or rebound. Negative signs include Murphy's sign, Rovsing's sign, McBurney's sign, psoas sign and obturator sign.     Hernia: No hernia is present.  Genitourinary:    Comments: Patinet declined rectal/ testicular exam. Hemoccult declines.  Denies any changing concerns.  Musculoskeletal:        General: Normal range of motion.     Cervical back: Normal range of motion and neck supple. Tenderness (left carotid endarectomy 09/22/19 ) present. No rigidity.  Lymphadenopathy:     Cervical: No cervical adenopathy.  Skin:    General: Skin is warm and dry.     Capillary Refill: Capillary refill takes less than 2 seconds.     Coloration: Skin is pale. Skin is not jaundiced.     Findings: Bruising (bilateral lower arms. ) present. No erythema, lesion or rash.  Neurological:     Mental Status: He is alert and oriented to person, place, and time.     GCS: GCS eye subscore is 4. GCS verbal subscore is 5. GCS motor subscore is 6.     Cranial Nerves: Cranial nerves are intact. No cranial nerve deficit.     Sensory: Sensation is intact. No sensory deficit.     Motor: Weakness (lower extremities left weaker than right/ strength lower leg 4/5. right is 5/5. upper extremities 5/5 bilatearl strength. grip strength 5/5 bilateral. ) present. No tremor.     Coordination: Coordination is intact. Coordination normal. Finger-Nose-Finger Test normal.      Gait: Gait normal.     Deep Tendon Reflexes: Reflexes are normal and symmetric. Reflexes normal.     Reflex Scores:      Tricep reflexes are 2+ on the right side and 2+ on the left side.      Brachioradialis reflexes are 2+ on the right side and 2+ on the left side.      Achilles reflexes are 2+ on the right side and 2+ on the left side. Psychiatric:        Mood and Affect: Mood normal.        Behavior: Behavior normal.        Thought Content: Thought content normal.        Judgment: Judgment normal.      Depression Screen Depression screen [facility] 2/9 10/11/2019  Decreased Interest 0  Down, Depressed, Hopeless 0  PHQ - 2 Score 0  Altered sleeping 0  Tired, decreased energy 1  Change in appetite 1  Feeling bad or failure about yourself  0  Trouble concentrating 0  Moving slowly or fidgety/restless 1  Suicidal thoughts 0  PHQ-9 Score 3  Difficult doing work/chores Not difficult at all      Assessment & Plan:     Routine Health Maintenance and Physical Exam  Exercise Activities and Dietary recommendations Goals   None     Immunization History  Administered Date(s) Administered  . Fluad Quad(high Dose 65+) 09/03/2019  .  Pneumococcal Polysaccharide-23 09/03/2019    Health Maintenance  Topic Date Due  . Hepatitis C Screening  [birth year]  . TETANUS/TDAP  10/09/1968  . COLONOSCOPY  10/10/1999  . PNA vac Low Risk Adult (2 of 2 - PCV13) 09/02/2020  . INFLUENZA VACCINE  Completed   He is seeing dentist, recommend yearly and as needed dentist with his poor dentition  1. Elevated MCV Checking B12 today possible macrocytic B12 anemia. He has not had work up for anemia.   His lowest hemoglobin was 6.8 over two weeks ago with HCT 21.3. Most recent check was 7.7 / 24.4 after carotid endarderectomy. RBC count is low.   2. Low hemoglobin Recent carotid endarterectomy. Will recheck CBC today - patient was unaware he has been anemic. He denies any transfusion. Will address with  labs resulting. He denies any bleeding.   - CBC with Differential/Platelet - B12 and Folate Panel - Lipid Panel w/o Chol/HDL Ratio - Iron and TIBC  3. Cerebrovascular accident (CVA) due to embolism of cerebral artery [facility])  He needs established care with cardiology as well given comorbidity. He would like to have a cardiologist. No symptoms of shortness of breath or chest pain.  - Ambulatory referral to Cardiology  4. Essential hypertension Managed with medication.   5. History of left-sided carotid endarterectomy Continue follow up with Dr. Lucky Cowboy.  - Comprehensive Metabolic Panel (CMET) - Lipid Panel w/o Chol/HDL Ratio - Ambulatory referral to Cardiology  6. Protein-calorie malnutrition, moderate ([place]) Likely due to poor dentition and torus. He will see ENT.  - TSH - Amb ref to Medical Nutrition Therapy-MNT - Ambulatory referral to ENT - Ambulatory referral to Gastroenterology  7. Slurred speech Improving since stroke. Doing [place] home health for speech therapy.   8. Weakness generalized He does feel week, since stroke. Also very anemic, will await labs and may need hematology evaluation.   9. History of stroke Continue follow up with Neurologist.  Last office note wanted patient to continue Plavix.  10. Urinary frequency  - PSA - POCT urinalysis dipstick- he was uable to give urine sample- he will return.   11. Chronic dental pain He is also seeing dental specialist. Also advised them to let provider know of recent stroke, carotid procedure as well as severe anemia on last labs in hospital.  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  Also advised them to let provider know of recent stroke, carotid procedure as well as severe anemia on last labs in hospital.   - Ambulatory referral to ENT  13. Nasal mass- skin nasal bridge 34mm on CT 09/03/19  incidental finding .  - Ambulatory referral to ENT  14. Anemia, unspecified  type Will recheck labs today and address. He is not symptomatic at today's visit. He does have fatigue.  - Ambulatory referral to Gastroenterology  15. History of abnormal weight loss Likely due to detention/ torus. Will have him evaluated for ways to gain weight with nutrition therapy.  - Amb ref to Medical Nutrition Therapy-MNT  16. Smoker Recommend quitting, health risk discussed/ Spent over 8 miniutes counseling on harms of smoking.  - Ambulatory referral to ENT  17. Cachexia ([place]) Will continue to monitor. Sister reports he is eating good. Patient reports lots of pain with chewing.  - Amb ref to Medical Nutrition Therapy-MNT - Ambulatory referral to ENT - Ambulatory referral to Gastroenterology  18. Body mass index (BMI) of 19 or less in adult  - Amb ref  to Medical Nutrition Therapy-MNT  19. Left-sided nosebleed Reported by patent.  - Ambulatory referral to ENT  20. Chronic constipation  Colace. Mira lax per package instructions. Increase fiber in diet.  - Ambulatory referral to Gastroenterology  21. Deviated nasal septum Seen on CT scan.  - Ambulatory referral to ENT  43. Was started on Metformin 500 mg BID at hospital by Dr. Posey Pronto, upon record review he has low readings as well. Discussed signs of hypoglycemia. Discussed monitoring. Will add on hemoglobin A1C.  Emergent RED flags discussed with return to emergency room immediately if occur or if any emergent symptoms.   Please schedule a follow up appointment in 1 month.  Advised patient call the office or your primary care doctor for an appointment if no improvement within 72 hours or if any symptoms change or worsen at any time  Advised ER or urgent Care if after hours or on weekend. Call 911 for emergency symptoms at any time.Patinet verbalized understanding of all instructions given/reviewed and treatment plan and has no further questions or concerns at this time.     Discussed health benefits of physical  activity, and encouraged him to engage in regular exercise appropriate for his age and condition.    --------------------------------------------------------------------

## 2019-10-11 NOTE — Telephone Encounter (Signed)
Okay to approve verbal order? KW

## 2019-10-12 LAB — TSH: TSH: 1.83 u[IU]/mL (ref 0.450–4.500)

## 2019-10-12 LAB — COMPREHENSIVE METABOLIC PANEL
ALT: 10 IU/L (ref 0–44)
AST: 20 IU/L (ref 0–40)
Albumin/Globulin Ratio: 1.6 (ref 1.2–2.2)
Albumin: 3.6 g/dL — ABNORMAL LOW (ref 3.8–4.8)
Alkaline Phosphatase: 68 IU/L (ref 39–117)
BUN/Creatinine Ratio: 14 (ref 10–24)
BUN: 9 mg/dL (ref 8–27)
Bilirubin Total: <0.2 mg/dL (ref 0.0–1.2)
CO2: 23 mmol/L (ref 20–29)
Calcium: 9.5 mg/dL (ref 8.6–10.2)
Chloride: 101 mmol/L (ref 96–106)
Creatinine, Ser: 0.66 mg/dL — ABNORMAL LOW (ref 0.76–1.27)
GFR calc Af Amer: 113 mL/min/{1.73_m2} (ref >59)
GFR calc non Af Amer: 98 mL/min/{1.73_m2} (ref >59)
Globulin, Total: 2.2 g/dL (ref 1.5–4.5)
Glucose: 77 mg/dL (ref 65–99)
Potassium: 4.5 mmol/L (ref 3.5–5.2)
Sodium: 139 mmol/L (ref 134–144)
Total Protein: 5.8 g/dL — ABNORMAL LOW (ref 6.0–8.5)

## 2019-10-12 LAB — PSA: Prostate Specific Ag, Serum: 2.9 ng/mL (ref 0.0–4.0)

## 2019-10-12 LAB — IRON AND TIBC
Iron Saturation: 23 % (ref 15–55)
Iron: 48 ug/dL (ref 38–169)
Total Iron Binding Capacity: 211 ug/dL — ABNORMAL LOW (ref 250–450)
UIBC: 163 ug/dL (ref 111–343)

## 2019-10-12 LAB — CBC WITH DIFFERENTIAL/PLATELET
Basophils Absolute: 0 10*3/uL (ref 0.0–0.2)
Basos: 0 %
EOS (ABSOLUTE): 0.2 10*3/uL (ref 0.0–0.4)
Eos: 2 %
Hematocrit: 26.8 % — ABNORMAL LOW (ref 37.5–51.0)
Hemoglobin: 8.9 g/dL — ABNORMAL LOW (ref 13.0–17.7)
Immature Grans (Abs): 0 10*3/uL (ref 0.0–0.1)
Immature Granulocytes: 0 %
Lymphocytes Absolute: 1.3 10*3/uL (ref 0.7–3.1)
Lymphs: 19 %
MCH: 37.4 pg — ABNORMAL HIGH (ref 26.6–33.0)
MCHC: 33.2 g/dL (ref 31.5–35.7)
MCV: 113 fL — ABNORMAL HIGH (ref 79–97)
Monocytes Absolute: 0.7 10*3/uL (ref 0.1–0.9)
Monocytes: 10 %
Neutrophils Absolute: 4.8 10*3/uL (ref 1.4–7.0)
Neutrophils: 69 %
Platelets: 449 10*3/uL (ref 150–450)
RBC: 2.38 x10E6/uL — CL (ref 4.14–5.80)
RDW: 12.3 % (ref 11.6–15.4)
WBC: 7.1 10*3/uL (ref 3.4–10.8)

## 2019-10-12 LAB — LIPID PANEL W/O CHOL/HDL RATIO
Cholesterol, Total: 176 mg/dL (ref 100–199)
HDL: 67 mg/dL (ref >39)
LDL Chol Calc (NIH): 91 mg/dL (ref 0–99)
Triglycerides: 103 mg/dL (ref 0–149)
VLDL Cholesterol Cal: 18 mg/dL (ref 5–40)

## 2019-10-12 LAB — B12 AND FOLATE PANEL
Folate: 13.5 ng/mL (ref >3.0)
Vitamin B-12: 1293 pg/mL — ABNORMAL HIGH (ref 232–1245)

## 2019-10-12 NOTE — Addendum Note (Signed)
Addended by: Doreen Beam on: 10/12/2019 01:28 PM   Modules accepted: Orders

## 2019-10-12 NOTE — Progress Notes (Addendum)
Please inform patient and sister who is his caregiver. He is very anemic still although slightly improved from 2 weeks ago Hemoglobin is 8.9 and RBC are low. If they are in agreement I want him to be seen by hematology at [facility] as soon as possible.- I will place a referral today and they should hear within one week.  I was unable to find a specific cause, Vitamin B12 is slightly elevated, folate normal. Iron level and Folate normal.  Prostate level is normal.  Thyroid normal.  Cholesterol is normal.  Just let me know if questions and also if they would like to proceed with Hematology.   Urgent hematology order was placed for further evaluation and treatment. 10/12/2019. Provider attempted to call patient and sister on file as well, no answer and voicemail full.

## 2019-10-13 ENCOUNTER — Other Ambulatory Visit: Payer: Self-pay | Admitting: Adult Health

## 2019-10-13 NOTE — Progress Notes (Signed)
10/13/2019 4:12 pm Still unable to reach patient or contact - sister on file. No voicemail available for patient and voicemail is full for sister.

## 2019-10-13 NOTE — Telephone Encounter (Signed)
Medication Refill - Medication: clopidogrel (PLAVIX) 75 MG tablet   Preferred Pharmacy (with phone number or street name):  [facility] [place] Phone:  [phone]  Fax:  [phone]       Agent: Please be advised that RX refills may take up to 3 business days. We ask that you follow-up with your pharmacy.

## 2019-10-16 NOTE — Progress Notes (Signed)
Will you have PEC keep trying to reach patient or sister?

## 2019-10-17 ENCOUNTER — Encounter: Payer: Self-pay | Admitting: *Deleted

## 2019-10-17 NOTE — Progress Notes (Signed)
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 He will be followed there. Nunzio Cory made sister aware of anemia, after finally being able to reach contact.  Provider attempted to call patient/ sister 10/17/19 to let them know we are here if they have questions or concerns. No answer.

## 2019-10-19 ENCOUNTER — Telehealth (INDEPENDENT_AMBULATORY_CARE_PROVIDER_SITE_OTHER): Payer: Self-pay

## 2019-10-19 NOTE — Telephone Encounter (Signed)
Patient sister has been made aware with medical advice and verbalized understanding

## 2019-10-19 NOTE — Telephone Encounter (Signed)
We are his vascular surgeon and we typically don't offer refills for oral pain as it is not something we treat.  She may want to try calling his PCP to see if they would be able to offer a refill.

## 2019-11-01 ENCOUNTER — Encounter: Payer: Self-pay | Admitting: *Deleted

## 2019-11-01 ENCOUNTER — Other Ambulatory Visit (INDEPENDENT_AMBULATORY_CARE_PROVIDER_SITE_OTHER): Payer: Self-pay | Admitting: Vascular Surgery

## 2019-11-01 DIAGNOSIS — Z9889 Other specified postprocedural states: Secondary | ICD-10-CM

## 2019-11-01 DIAGNOSIS — I6522 Occlusion and stenosis of left carotid artery: Secondary | ICD-10-CM

## 2019-11-01 NOTE — Progress Notes (Deleted)
New patient contacted x 3 phone calls. No answer. Unable to leave vm on patient's phone. Left vm on sister's phone.

## 2019-11-02 ENCOUNTER — Inpatient Hospital Stay: Payer: Medicare Other | Admitting: Internal Medicine

## 2019-11-03 ENCOUNTER — Encounter (INDEPENDENT_AMBULATORY_CARE_PROVIDER_SITE_OTHER): Payer: Self-pay | Admitting: Nurse Practitioner

## 2019-11-03 ENCOUNTER — Other Ambulatory Visit: Payer: Self-pay

## 2019-11-03 ENCOUNTER — Ambulatory Visit (INDEPENDENT_AMBULATORY_CARE_PROVIDER_SITE_OTHER): Payer: Medicare Other

## 2019-11-03 ENCOUNTER — Ambulatory Visit (INDEPENDENT_AMBULATORY_CARE_PROVIDER_SITE_OTHER): Payer: Medicare Other | Admitting: Nurse Practitioner

## 2019-11-03 VITALS — BP 153/69 | HR 80 | Resp 16 | Wt 112.6 lb

## 2019-11-03 DIAGNOSIS — F17200 Nicotine dependence, unspecified, uncomplicated: Secondary | ICD-10-CM | POA: Diagnosis not present

## 2019-11-03 DIAGNOSIS — I6522 Occlusion and stenosis of left carotid artery: Secondary | ICD-10-CM | POA: Diagnosis not present

## 2019-11-03 DIAGNOSIS — I10 Essential (primary) hypertension: Secondary | ICD-10-CM | POA: Diagnosis not present

## 2019-11-03 DIAGNOSIS — I63239 Cerebral infarction due to unspecified occlusion or stenosis of unspecified carotid arteries: Secondary | ICD-10-CM

## 2019-11-03 DIAGNOSIS — Z9889 Other specified postprocedural states: Secondary | ICD-10-CM

## 2019-11-03 NOTE — Progress Notes (Signed)
SUBJECTIVE:  Patient ID: Eugene Strong, male    DOB: [birth year], 69 y.o.   MRN: [id number] Chief Complaint  Patient presents with  . Follow-up    4-6week ultrasound follow up    HPI  Eugene Strong is a 69 y.o. male The patient is seen for follow up evaluation of carotid stenosis status post left carotid endarterectomy on 09/21/2019.  There were no post operative problems or complications related to the surgery.  The patient denies neck or incisional pain.  The incision is well-healed at this time.  The patient denies interval amaurosis fugax. There is no recent history of TIA symptoms or focal motor deficits. There is no prior documented CVA.  The patient denies headache.  The patient is taking enteric-coated aspirin 81 mg daily.  The patient has a history of coronary artery disease, no recent episodes of angina or shortness of breath. The patient denies PAD or claudication symptoms. There is a history of hyperlipidemia which is being treated with a statin.   Today the patient has a 1 to 39% stenosis of the left internal carotid artery with a 40 to 59% stenosis of the right internal carotid artery.  The bilateral vertebrals demonstrate antegrade flow and there are normal flow hemodynamics seen within the bilateral subclavian arteries. Past Medical History:  Diagnosis Date  . Allergy   . Anemia   . Anxiety   . Depression   . Diabetes mellitus without complication ([place])    recently diagnosed  . Dyspnea   . Headache   . Mouth sores 08/2019   uses Dukes mouthwash to minimize discomfort  . Stroke ([place]) 09/01/2019   legs still remain weak, slurring of speech  . Tongue cancer ([place])    left tongue squamous cell carcinoma    Past Surgical History:  Procedure Laterality Date  . ENDARTERECTOMY Left 09/21/2019   Procedure: ENDARTERECTOMY CAROTID;  Surgeon: Algernon Huxley, MD;  Location: [facility] ORS;  Service: Vascular;  Laterality: Left;  . SURGERY SCROTAL / TESTICULAR  1961     Social History   Socioeconomic History  . Marital status: Single    Spouse name: Not on file  . Number of children: Not on file  . Years of education: Not on file  . Highest education level: Not on file  Occupational History  . 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 Smoking status: Current Every Day Smoker    Packs/day: 0.25  . Smokeless tobacco: Never Used  Substance and Sexual Activity  . Alcohol use: Yes    Alcohol/week: 1.0 - 2.0 standard drinks    Types: 1 - 2 Cans of beer per week  . Drug use: Never  . Sexual activity: Not Currently  Other Topics Concern  . Not on file  Social History Narrative   Lives alone. Sister lives nearby. Patient has been staying with her since stroke 08/2019.    He will return to her house after his surgery.   Social Determinants of Health   Financial Resource Strain:   . Difficulty of Paying Living Expenses: Not on file  Food Insecurity:   . Worried About Charity fundraiser in the Last Year: Not on file  . Ran Out of Food in the Last Year: Not on file  Transportation Needs:   . Lack of Transportation (Medical): Not on file  . Lack of Transportation (Non-Medical): Not on file  Physical Activity:   . Days of Exercise  per Week: Not on file  . Minutes of Exercise per Session: Not on file  Stress:   . Feeling of Stress : Not on file  Social Connections:   . Frequency of Communication with Friends and Family: Not on file  . Frequency of Social Gatherings with Friends and Family: Not on file  . Attends Religious Services: Not on file  . Active Member of Clubs or Organizations: Not on file  . Attends Archivist Meetings: Not on file  . Marital Status: Not on file  Intimate Partner Violence:   . Fear of Current or Ex-Partner: Not on file  . Emotionally Abused: Not on file  . Physically Abused: Not on file  . Sexually Abused: Not on file    Family History  Problem Relation Age of Onset  .  Goiter Father   . Breast cancer Mother   . Thyroid disease Mother   . Lung cancer Maternal Grandfather     Allergies  Allergen Reactions  . Codeine Rash  . Penicillins Rash     Review of Systems   Review of Systems: Negative Unless Checked Constitutional: [] Weight loss  [] Fever  [] Chills Cardiac: [] Chest pain   []  Atrial Fibrillation  [] Palpitations   [] Shortness of breath when laying flat   [] Shortness of breath with exertion. [] Shortness of breath at rest Vascular:  [] Pain in legs with walking   [] Pain in legs with standing [] Pain in legs when laying flat   [] Claudication    [] Pain in feet when laying flat    [] History of DVT   [] Phlebitis   [] Swelling in legs   [] Varicose veins   [] Non-healing ulcers Pulmonary:   [] Uses home oxygen   [] Productive cough   [] Hemoptysis   [] Wheeze  [] COPD   [] Asthma Neurologic:  [] Dizziness   [] Seizures  [] Blackouts [x] History of stroke   [] History of TIA  [] Aphasia   [] Temporary Blindness   [] Weakness or numbness in arm   [] Weakness or numbness in leg Musculoskeletal:   [] Joint swelling   [] Joint pain   [] Low back pain  []  History of Knee Replacement [] Arthritis [] back Surgeries  []  Spinal Stenosis    Hematologic:  [] Easy bruising  [] Easy bleeding   [] Hypercoagulable state   [x] Anemic Gastrointestinal:  [] Diarrhea   [] Vomiting  [] Gastroesophageal reflux/heartburn   [] Difficulty swallowing. [] Abdominal pain Genitourinary:  [] Chronic kidney disease   [] Difficult urination  [] Anuric   [] Blood in urine [] Frequent urination  [] Burning with urination   [] Hematuria Skin:  [] Rashes   [] Ulcers [] Wounds Psychological:  [] History of anxiety   []  History of major depression  []  Memory Difficulties      OBJECTIVE:   Physical Exam  BP (!) 153/69 (BP Location: Right Arm)   Pulse 80   Resp 16   Wt 112 lb 9.6 oz (51.1 kg)   BMI 14.46 kg/m   Gen: WD/WN, NAD, frail-appearing Head: [place]/AT, No temporalis wasting.  Ear/Nose/Throat: Hearing grossly intact, nares  w/o erythema or drainage Eyes: PER, EOMI, sclera nonicteric.  Neck: Supple, no masses.  No JVD.  Pulmonary:  Good air movement, no use of accessory muscles.  Cardiac: RRR Vascular:  Well-healed endarterectomy incision, no bruits noted Vessel Right Left  Radial Palpable Palpable   Gastrointestinal: soft, non-distended. No guarding/no peritoneal signs.  Musculoskeletal: M/S 5/5 throughout.  No deformity or atrophy.  Neurologic: Pain and light touch intact in extremities.  Symmetrical.  Speech is fluent. Motor exam as listed above. Psychiatric: Judgment intact, Mood & affect appropriate for  pt's clinical situation. Dermatologic: No Venous rashes. No Ulcers Noted.  No changes consistent with cellulitis. Lymph : No Cervical lymphadenopathy, no lichenification or skin changes of chronic lymphedema.       ASSESSMENT AND PLAN:  1. Carotid stenosis, symptomatic, with infarction [facility]) Recommend:  The patient is s/p successful left  CEA  Duplex ultrasound preoperatively shows 40-59% contralateral stenosis.  Continue antiplatelet therapy as prescribed Continue management of CAD, HTN and Hyperlipidemia Healthy heart diet,  encouraged exercise at least 4 times per week  Follow up in 3 months with duplex ultrasound and physical exam based on the patient's carotid surgery and >50% stenosis of the left carotid artery    2. Essential hypertension Continue antihypertensive medications as already ordered, these medications have been reviewed and there are no changes at this time.   3. Smoker Smoking cessation was discussed, 3-10 minutes spent on this topic specifically    Current Outpatient Medications on File Prior to Visit  Medication Sig Dispense Refill  . aspirin EC 81 MG EC tablet Take 1 tablet (81 mg total) by mouth daily. 150 tablet 0  . atorvastatin (LIPITOR) 80 MG tablet Take 1 tablet (80 mg total) by mouth daily at 6 PM. 30 tablet 0  . clopidogrel (PLAVIX) 75 MG tablet Take 1  tablet (75 mg total) by mouth daily. 30 tablet 0  . feeding supplement, ENSURE ENLIVE, (ENSURE ENLIVE) LIQD Take 237 mLs by mouth 3 (three) times daily with meals. 237 mL 12  . magic mouthwash SOLN Take 15 mLs by mouth 4 (four) times daily. 600 mL 8  . metFORMIN (GLUCOPHAGE) 500 MG tablet Take 1 tablet (500 mg total) by mouth 2 (two) times daily with a meal. 60 tablet 0  . Multiple Vitamin (MULTIVITAMIN WITH MINERALS) TABS tablet Take 1 tablet by mouth daily. 150 tablet 0  . traMADol (ULTRAM) 50 MG tablet Take 50 mg by mouth every 4 (four) hours as needed.    . polyethylene glycol (MIRALAX / GLYCOLAX) 17 g packet Take 17 g by mouth daily. (Patient not taking: Reported on 10/11/2019) 14 each 0   No current facility-administered medications on file prior to visit.    There are no Patient Instructions on file for this visit. No follow-ups on file.   Kris Hartmann, NP  This note was completed with Sales executive.  Any errors are purely unintentional.

## 2019-11-07 ENCOUNTER — Other Ambulatory Visit: Payer: Self-pay

## 2019-11-07 ENCOUNTER — Inpatient Hospital Stay: Payer: Medicare Other | Attending: Internal Medicine | Admitting: Internal Medicine

## 2019-11-07 ENCOUNTER — Inpatient Hospital Stay: Payer: Medicare Other

## 2019-11-07 ENCOUNTER — Encounter: Payer: Self-pay | Admitting: Internal Medicine

## 2019-11-07 VITALS — BP 168/75 | HR 75 | Temp 96.9°F | Wt 108.0 lb

## 2019-11-07 DIAGNOSIS — R634 Abnormal weight loss: Secondary | ICD-10-CM | POA: Insufficient documentation

## 2019-11-07 DIAGNOSIS — F17210 Nicotine dependence, cigarettes, uncomplicated: Secondary | ICD-10-CM | POA: Diagnosis not present

## 2019-11-07 DIAGNOSIS — Z8673 Personal history of transient ischemic attack (TIA), and cerebral infarction without residual deficits: Secondary | ICD-10-CM | POA: Diagnosis not present

## 2019-11-07 DIAGNOSIS — D539 Nutritional anemia, unspecified: Secondary | ICD-10-CM

## 2019-11-07 DIAGNOSIS — C029 Malignant neoplasm of tongue, unspecified: Secondary | ICD-10-CM | POA: Insufficient documentation

## 2019-11-07 DIAGNOSIS — Z79899 Other long term (current) drug therapy: Secondary | ICD-10-CM | POA: Insufficient documentation

## 2019-11-07 LAB — CBC WITH DIFFERENTIAL/PLATELET
Abs Immature Granulocytes: 0.02 10*3/uL (ref 0.00–0.07)
Basophils Absolute: 0.1 10*3/uL (ref 0.0–0.1)
Basophils Relative: 1 %
Eosinophils Absolute: 0.2 10*3/uL (ref 0.0–0.5)
Eosinophils Relative: 2 %
HCT: 30.2 % — ABNORMAL LOW (ref 39.0–52.0)
Hemoglobin: 9.4 g/dL — ABNORMAL LOW (ref 13.0–17.0)
Immature Granulocytes: 0 %
Lymphocytes Relative: 14 %
Lymphs Abs: 1 10*3/uL (ref 0.7–4.0)
MCH: 35.7 pg — ABNORMAL HIGH (ref 26.0–34.0)
MCHC: 31.1 g/dL (ref 30.0–36.0)
MCV: 114.8 fL — ABNORMAL HIGH (ref 80.0–100.0)
Monocytes Absolute: 0.5 10*3/uL (ref 0.1–1.0)
Monocytes Relative: 7 %
Neutro Abs: 5.4 10*3/uL (ref 1.7–7.7)
Neutrophils Relative %: 76 %
Platelets: 382 10*3/uL (ref 150–400)
RBC: 2.63 MIL/uL — ABNORMAL LOW (ref 4.22–5.81)
RDW: 11.7 % (ref 11.5–15.5)
WBC: 7.2 10*3/uL (ref 4.0–10.5)
nRBC: 0 % (ref 0.0–0.2)

## 2019-11-07 LAB — COMPREHENSIVE METABOLIC PANEL
ALT: 33 U/L (ref 0–44)
AST: 22 U/L (ref 15–41)
Albumin: 4.1 g/dL (ref 3.5–5.0)
Alkaline Phosphatase: 68 U/L (ref 38–126)
Anion gap: 10 (ref 5–15)
BUN: 16 mg/dL (ref 8–23)
CO2: 27 mmol/L (ref 22–32)
Calcium: 9.8 mg/dL (ref 8.9–10.3)
Chloride: 101 mmol/L (ref 98–111)
Creatinine, Ser: 0.6 mg/dL — ABNORMAL LOW (ref 0.61–1.24)
GFR calc Af Amer: >60 mL/min (ref >60)
GFR calc non Af Amer: >60 mL/min (ref >60)
Glucose, Bld: 94 mg/dL (ref 70–99)
Potassium: 4.3 mmol/L (ref 3.5–5.1)
Sodium: 138 mmol/L (ref 135–145)
Total Bilirubin: 0.2 mg/dL — ABNORMAL LOW (ref 0.3–1.2)
Total Protein: 7.4 g/dL (ref 6.5–8.1)

## 2019-11-07 LAB — LACTATE DEHYDROGENASE: LDH: 125 U/L (ref 98–192)

## 2019-11-07 LAB — HEPATITIS B CORE ANTIBODY, IGM: Hep B C IgM: NONREACTIVE

## 2019-11-07 LAB — TECHNOLOGIST SMEAR REVIEW: Plt Morphology: ADEQUATE

## 2019-11-07 LAB — RETICULOCYTES
Immature Retic Fract: 8.3 % (ref 2.3–15.9)
RBC.: 2.64 MIL/uL — ABNORMAL LOW (ref 4.22–5.81)
Retic Count, Absolute: 39.1 10*3/uL (ref 19.0–186.0)
Retic Ct Pct: 1.5 % (ref 0.4–3.1)

## 2019-11-07 LAB — HEPATITIS C ANTIBODY: HCV Ab: NONREACTIVE

## 2019-11-07 LAB — HEPATITIS B SURFACE ANTIGEN: Hepatitis B Surface Ag: NONREACTIVE

## 2019-11-07 NOTE — Assessment & Plan Note (Addendum)
#  Macrocytic anemia-MCV 1 13-1 16; hemoglobin 7-8.  Unclear etiology.  Discussed multiple etiologies including but not limited to nutritional deficiency T96 folic acid; liver disease and bone marrow problems.  #Discussed that if blood work is unremarkable-then would recommend bone marrow biopsy for evaluation-evaluate for primary bone marrow disorders..  Awaiting a PET scan on 2/26- thru [place].  #Clinical squamous cell carcinoma of the left side of the tongue-work-up in progress PET scan as above.  S/p evaluation with oral surgery.  Questionable candidate for surgery-definitive radiation as alternative.  Again work-up in progress as above  #Cachexia/weight loss secondary to-on lead malignancy difficulty swallowing-as above  Thank you, Ms.Flinchum, FNP for allowing me to participate in the care of your pleasant patient. Please do not hesitate to contact me with questions or concerns in the interim.  #Get CBC CMP hepatitis studiesfolic acid LDH myeloma panel kappa lambda light chain ratio haptoglobin reticulocyte count; review of smear.  # DISPOSITION: # labs today # follow up in 2 weeks; no labs- Dr.B

## 2019-11-07 NOTE — Progress Notes (Signed)
[place] CONSULT NOTE  Patient Care Team: Flinchum, Kelby Aline, FNP as PCP - General (Family Medicine)  CHIEF COMPLAINTS/PURPOSE OF CONSULTATION: anemia   HEMATOLOGY HISTORY  # Macrocytic ANEMIA [no prior EGD/colonoscopy]-hemoglobin 7-8; MCV110; denies heavy alcohol; B12/iron studies ferritin-unremarkable  #  large left-sided squamous cell carcinoma of the tongue-[facility] Oral surgery; PET-pending  # STROKE x3 [dec 2020; Left CEA; Dr.Dew]  HISTORY OF PRESENTING ILLNESS: Patient is a poor historian.  Accompanied with sister.   Eugene Strong 69 y.o.  male has been referred to [place] for further evaluation/work-up for anemia.  Patient states that admitted to the hospital December 2020 for stroke-ended up having left carotid endarterectomy with Dr. Lucky Cowboy.  Patient has mild residual deficits from his stroke overall improving.  Patient also diagnosed with large left-sided squamous cell carcinoma of the tongue [clinical no biopsy noted].  Recently evaluated with oral surgery at [facility].  Review of note suggestive of extensive surgery needed; but given patient's multiple comorbidities/recent stroke CVA-significant concerns of his tolerance to major surgery.  Awaiting PET scan this week  Patient has difficulty swallowing because of his oral malignancy.  Has lost about 70 pounds.  Patient is currently getting home health.  Blood in stools: None Change in bowel habits- None Blood in urine: None Difficulty swallowing: Yes- [facility] work up.  Abnormal weight loss: sec to above.  Iron supplementation: not on PO iron.  Prior Blood transfusions: none.    Review of Systems  Constitutional: Positive for weight loss. Negative for chills, diaphoresis, fever and malaise/fatigue.  HENT: Negative for nosebleeds and sore throat.   Eyes: Negative for double vision.  Respiratory: Negative for cough, hemoptysis, sputum production, shortness of breath and wheezing.   Cardiovascular: Negative for chest  pain, palpitations, orthopnea and leg swelling.  Gastrointestinal: Negative for abdominal pain, blood in stool, constipation, diarrhea, heartburn, melena, nausea and vomiting.  Genitourinary: Negative for dysuria, frequency and urgency.  Musculoskeletal: Positive for back pain and joint pain.  Skin: Negative.  Negative for itching and rash.  Neurological: Positive for focal weakness. Negative for dizziness, tingling, weakness and headaches.  Endo/Heme/Allergies: Does not bruise/bleed easily.  Psychiatric/Behavioral: Negative for depression. The patient is not nervous/anxious and does not have insomnia.     MEDICAL HISTORY:  Past Medical History:  Diagnosis Date  . Allergy   . Anemia   . Anxiety   . Depression   . Diabetes mellitus without complication ([place])    recently diagnosed  . Dyspnea   . Headache   . Mouth sores 08/2019   uses Dukes mouthwash to minimize discomfort  . Stroke ([place]) 09/01/2019   legs still remain weak, slurring of speech  . Tongue cancer ([place])    left tongue squamous cell carcinoma    SURGICAL HISTORY: Past Surgical History:  Procedure Laterality Date  . ENDARTERECTOMY Left 09/21/2019   Procedure: ENDARTERECTOMY CAROTID;  Surgeon: Algernon Huxley, MD;  Location: [facility] ORS;  Service: Vascular;  Laterality: Left;  . SURGERY SCROTAL / TESTICULAR  1961    SOCIAL HISTORY: Social History   Socioeconomic History  . Marital status: Single    Spouse name: Not on file  . Number of children: Not on file  . Years of education: Not on file  . Highest education level: Not on file  Occupational History  . 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 Smoking status: Current Every Day Smoker    Packs/day: 0.25  . Smokeless  tobacco: Never Used  Substance and Sexual Activity  . Alcohol use: Yes    Alcohol/week: 1.0 - 2.0 standard drinks    Types: 1 - 2 Cans of beer per week  . Drug use: Never  . Sexual activity: Not Currently  Other Topics  Concern  . Not on file  Social History Narrative   Lives alone in [place]; with sister. Sister lives nearby. Patient has been staying with her since stroke 08/2019.  He will return to her house after his surgery. 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    Social Determinants of Health   Financial Resource Strain:   . Difficulty of Paying Living Expenses: Not on file  Food Insecurity:   . Worried About Charity fundraiser in the Last Year: Not on file  . Ran Out of Food in the Last Year: Not on file  Transportation Needs:   . Lack of Transportation (Medical): Not on file  . Lack of Transportation (Non-Medical): Not on file  Physical Activity:   . Days of Exercise per Week: Not on file  . Minutes of Exercise per Session: Not on file  Stress:   . Feeling of Stress : Not on file  Social Connections:   . Frequency of Communication with Friends and Family: Not on file  . Frequency of Social Gatherings with Friends and Family: Not on file  . Attends Religious Services: Not on file  . Active Member of Clubs or Organizations: Not on file  . Attends Archivist Meetings: Not on file  . Marital Status: Not on file  Intimate Partner Violence:   . Fear of Current or Ex-Partner: Not on file  . Emotionally Abused: Not on file  . Physically Abused: Not on file  . Sexually Abused: Not on file    FAMILY HISTORY: Family History  Problem Relation Age of Onset  . Goiter Father   . Breast cancer Mother   . Thyroid disease Mother   . Lung cancer Maternal Grandfather     ALLERGIES:  is allergic to codeine and penicillins.  MEDICATIONS:  Current Outpatient Medications  Medication Sig Dispense Refill  . aspirin EC 81 MG EC tablet Take 1 tablet (81 mg total) by mouth daily. 150 tablet 0  . atorvastatin (LIPITOR) 80 MG tablet Take 1 tablet (80 mg total) by mouth daily at 6 PM. 30 tablet 0  . clopidogrel (PLAVIX) 75 MG tablet Take 1 tablet (75 mg total) by mouth  daily. 30 tablet 0  . feeding supplement, ENSURE ENLIVE, (ENSURE ENLIVE) LIQD Take 237 mLs by mouth 3 (three) times daily with meals. 237 mL 12  . magic mouthwash SOLN Take 15 mLs by mouth 4 (four) times daily. 600 mL 8  . metFORMIN (GLUCOPHAGE) 500 MG tablet Take 1 tablet (500 mg total) by mouth 2 (two) times daily with a meal. 60 tablet 0  . Multiple Vitamin (MULTIVITAMIN WITH MINERALS) TABS tablet Take 1 tablet by mouth daily. 150 tablet 0  . traMADol (ULTRAM) 50 MG tablet Take 50 mg by mouth every 4 (four) hours as needed.    . polyethylene glycol (MIRALAX / GLYCOLAX) 17 g packet Take 17 g by mouth daily. (Patient not taking: Reported on 10/11/2019) 14 each 0   No current facility-administered medications for this visit.      PHYSICAL EXAMINATION:   Vitals:   11/07/19 1114  BP: (!) 168/75  Pulse: 75  Temp: (!) 96.9 F (36.1 C)  Filed Weights   11/07/19 1114  Weight: 108 lb (49 kg)    Physical Exam  Constitutional: He is oriented to person, place, and time and well-developed, well-nourished, and in no distress.  Thin cachectic appearing male patient.  Accompanied by his sister.  Is walking independently.  HENT:  Head: Normocephalic and atraumatic.  Mouth/Throat: Oropharynx is clear and moist. No oropharyngeal exudate.  A large ulcerated lesion noted in the left side of the tongue.  No bleeding noted  Eyes: Pupils are equal, round, and reactive to light.  Cardiovascular: Normal rate and regular rhythm.  Pulmonary/Chest: No respiratory distress. He has no wheezes.  Decreased air entry bilaterally.  Abdominal: Soft. Bowel sounds are normal. He exhibits no distension and no mass. There is no abdominal tenderness. There is no rebound and no guarding.  Musculoskeletal:        General: No tenderness or edema. Normal range of motion.     Cervical back: Normal range of motion and neck supple.  Neurological: He is alert and oriented to person, place, and time.  Skin: Skin is  warm.  Psychiatric: Affect normal.    LABORATORY DATA:  I have reviewed the data as listed Lab Results  Component Value Date   WBC 7.1 10/11/2019   HGB 8.9 (L) 10/11/2019   HCT 26.8 (L) 10/11/2019   MCV 113 (H) 10/11/2019   PLT 449 10/11/2019   Recent Labs    09/01/19 1038 09/04/19 0531 09/19/19 0913 09/22/19 0548 10/11/19 1108  NA 140   < > 137 139 139  K 4.1   < > 4.0 4.5 4.5  CL 107   < > 102 109 101  CO2 20*   < > '24 23 23  ' GLUCOSE 107*   < > 112* 133* 77  BUN 23   < > '18 15 9  ' CREATININE 0.91   < > 0.68 0.80 0.66*  CALCIUM 10.0   < > 9.4 8.0* 9.5  GFRNONAA >60   < > >60 >60 98  GFRAA >60   < > >60 >60 113  PROT 7.5  --   --   --  5.8*  ALBUMIN 3.8  --   --   --  3.6*  AST 23  --   --   --  20  ALT 9  --   --   --  10  ALKPHOS 48  --   --   --  68  BILITOT 0.6  --   --   --  <0.2   < > = values in this interval not displayed.     No results found.  Macrocytic anemia #Macrocytic anemia-MCV 1 13-1 16; hemoglobin 7-8.  Unclear etiology.  Discussed multiple etiologies including but not limited to nutritional deficiency Y81 folic acid; liver disease and bone marrow problems.  #Discussed that if blood work is unremarkable-then would recommend bone marrow biopsy for evaluation-evaluate for primary bone marrow disorders..  Awaiting a PET scan on 2/26- thru [place].  #Clinical squamous cell carcinoma of the left side of the tongue-work-up in progress PET scan as above.  S/p evaluation with oral surgery.  Questionable candidate for surgery-definitive radiation as alternative.  Again work-up in progress as above  #Cachexia/weight loss secondary to-on lead malignancy difficulty swallowing-as above  Thank you, Ms.Flinchum, FNP for allowing me to participate in the care of your pleasant patient. Please do not hesitate to contact me with questions or concerns in the interim.  #Get CBC CMP hepatitis  studiesfolic acid LDH myeloma panel kappa lambda light chain ratio haptoglobin  reticulocyte count; review of smear.  # DISPOSITION: # labs today # follow up in 2 weeks; no labs- Dr.B   All questions were answered. The patient knows to call the clinic with any problems, questions or concerns.    Cammie Sickle, MD 11/07/2019 12:49 PM

## 2019-11-08 LAB — KAPPA/LAMBDA LIGHT CHAINS
Kappa free light chain: 23.2 mg/L — ABNORMAL HIGH (ref 3.3–19.4)
Kappa, lambda light chain ratio: 1.09 (ref 0.26–1.65)
Lambda free light chains: 21.2 mg/L (ref 5.7–26.3)

## 2019-11-08 LAB — FOLATE: Folate: 23 ng/mL (ref >5.9)

## 2019-11-08 LAB — HAPTOGLOBIN: Haptoglobin: 222 mg/dL (ref 32–363)

## 2019-11-09 LAB — MULTIPLE MYELOMA PANEL, SERUM
Albumin SerPl Elph-Mcnc: 3.9 g/dL (ref 2.9–4.4)
Albumin/Glob SerPl: 1.4 (ref 0.7–1.7)
Alpha 1: 0.3 g/dL (ref 0.0–0.4)
Alpha2 Glob SerPl Elph-Mcnc: 0.9 g/dL (ref 0.4–1.0)
B-Globulin SerPl Elph-Mcnc: 1 g/dL (ref 0.7–1.3)
Gamma Glob SerPl Elph-Mcnc: 0.6 g/dL (ref 0.4–1.8)
Globulin, Total: 2.8 g/dL (ref 2.2–3.9)
IgA: 92 mg/dL (ref 61–437)
IgG (Immunoglobin G), Serum: 787 mg/dL (ref 603–1613)
IgM (Immunoglobulin M), Srm: 62 mg/dL (ref 20–172)
Total Protein ELP: 6.7 g/dL (ref 6.0–8.5)

## 2019-11-13 ENCOUNTER — Ambulatory Visit (INDEPENDENT_AMBULATORY_CARE_PROVIDER_SITE_OTHER): Payer: Medicare Other | Admitting: Adult Health

## 2019-11-13 ENCOUNTER — Other Ambulatory Visit: Payer: Self-pay

## 2019-11-13 ENCOUNTER — Ambulatory Visit
Admission: RE | Admit: 2019-11-13 | Discharge: 2019-11-13 | Disposition: A | Discharge status: Home / Self Care | Payer: Medicare Other | Source: Ambulatory Visit | Attending: Adult Health | Admitting: Adult Health

## 2019-11-13 ENCOUNTER — Encounter: Payer: Self-pay | Admitting: Adult Health

## 2019-11-13 VITALS — BP 132/72 | HR 82 | Temp 97.3°F | Resp 16 | Wt 113.2 lb

## 2019-11-13 DIAGNOSIS — J42 Unspecified chronic bronchitis: Secondary | ICD-10-CM

## 2019-11-13 DIAGNOSIS — R636 Underweight: Secondary | ICD-10-CM | POA: Insufficient documentation

## 2019-11-13 DIAGNOSIS — C029 Malignant neoplasm of tongue, unspecified: Secondary | ICD-10-CM | POA: Insufficient documentation

## 2019-11-13 DIAGNOSIS — D649 Anemia, unspecified: Secondary | ICD-10-CM

## 2019-11-13 DIAGNOSIS — Z9889 Other specified postprocedural states: Secondary | ICD-10-CM

## 2019-11-13 DIAGNOSIS — F17200 Nicotine dependence, unspecified, uncomplicated: Secondary | ICD-10-CM

## 2019-11-13 DIAGNOSIS — R64 Cachexia: Secondary | ICD-10-CM

## 2019-11-13 DIAGNOSIS — I6340 Cerebral infarction due to embolism of unspecified cerebral artery: Secondary | ICD-10-CM | POA: Diagnosis not present

## 2019-11-13 DIAGNOSIS — F17209 Nicotine dependence, unspecified, with unspecified nicotine-induced disorders: Secondary | ICD-10-CM | POA: Insufficient documentation

## 2019-11-13 DIAGNOSIS — R062 Wheezing: Secondary | ICD-10-CM | POA: Diagnosis not present

## 2019-11-13 MED ORDER — ALBUTEROL SULFATE HFA 108 (90 BASE) MCG/ACT IN AERS: 1.0000 | INHALATION_SPRAY | Freq: Four times a day (QID) | RESPIRATORY_TRACT | 0 refills | Status: AC | PRN

## 2019-11-13 MED ORDER — TRAMADOL HCL 50 MG PO TABS: 50.0000 mg | ORAL_TABLET | Freq: Four times a day (QID) | ORAL | 0 refills | Status: AC | PRN

## 2019-11-13 MED ORDER — BUDESONIDE-FORMOTEROL FUMARATE 80-4.5 MCG/ACT IN AERO: 1.0000 | INHALATION_SPRAY | Freq: Two times a day (BID) | RESPIRATORY_TRACT | 0 refills | Status: AC

## 2019-11-13 NOTE — Addendum Note (Signed)
Addended by: Doreen Beam on: 11/13/2019 11:49 AM   Modules accepted: Level of Service

## 2019-11-13 NOTE — Addendum Note (Signed)
Addended by: Doreen Beam on: 11/13/2019 11:47 AM   Modules accepted: Miquel Dunn

## 2019-11-13 NOTE — Patient Instructions (Signed)
Oral Cancer Oral cancer occurs when cells on the floor of the mouth, tongue, lining of the mouth, or lips become abnormal and grow out of control. This usually starts in very thin, flat cells that line the surface of the mouth called squamous cells. Cancer cells can spread and form a mass of cells called a tumor. The cancer may spread deeper into the tissues of the mouth, or it may spread to other areas of the body (metastasize). What are the causes? The cause of this condition is not known. What increases the risk? This condition is more likely to develop in:  People who use tobacco products, such as cigarettes, chewing tobacco, and e-cigarettes. Tobacco use is the number-one risk factor of cancer of the lip.  Men.  People who: ? Are over age 71. ? Drink alcohol excessively. People who use both tobacco and alcohol are at an even higher risk. ? Have a history of human papillomavirus (HPV). ? Have a weakened body defense system (immune system). ? Do not brush or floss their teeth regularly. ? Are frequently exposed to sunlight or man-made (artificial) sunlight, such as in a tanning bed. What are the signs or symptoms? Symptoms of this condition may include:  A lump or open sore (ulcer) on the floor of the mouth, tongue, lining of the mouth, or lip that does not heal. This is the most common symptom.  Bleeding from the mouth.  Pain or numbness on the floor of the mouth, tongue, lining of the mouth, or lip.  Drooling while taking a drink.  A change in the way words and sounds are formed (articulation).  A lump in the neck. In some cases, there may be no symptoms. The cancer may be found during a routine dental exam or a primary care exam. How is this diagnosed? This condition may be diagnosed based on:  A physical exam of your lips, mouth, and neck. To do this exam, your health care provider may use a long-handled mirror or a flexible tube with a camera (fiberscope).  Taking a  sample from the affected area and checking it under a microscope (biopsy).  Imaging exams of the floor of the mouth, tongue, lip, and body, such as: ? X-rays. ? CT scan. ? PET scan. ? MRI. ? Bone scan. If oral cancer is confirmed, it will be staged to determine its severity and extent. Staging is an assessment of:  The size of the tumor.  Whether the cancer has spread.  Where the cancer has spread. Cancer may return (recur) after initial treatment. Recurrent cancer can occur in the same location or in another part of the body. How is this treated? Treatment for this condition depends on the stage of the cancer. Treatment may include one or more of the following:  Surgery. This removes as much of the cancer as possible. This is the primary treatment for oral cavity cancer. ? Surgery for early-stage oral cancer will have limited effects on the appearance or function of your mouth. ? Surgery for more advanced oral cancers may have significant effects on the appearance and function of your mouth.  Chemotherapy. This uses medicines to kill the cancer cells.  Radiation therapy. This uses high-energy rays to kill the cancer cells.  Targeted drug therapy. This uses medicines that block cancer from growing and spreading. A combination of surgery, radiation, chemotherapy, and targeted drug therapy may be used to treat oral cancer. Follow these instructions at home: Medicines  Take over-the-counter and prescription  medicines only as told by your health care provider.  Do not drive or use heavy machinery while taking prescription pain medicine.  If you are taking prescription pain medicine, you may need to take actions to prevent or treat constipation: ? Drink enough fluid to keep your urine pale yellow. ? Take over-the-counter or prescription medicines. ? Eat foods that are high in fiber, such as beans, whole grains, and fresh fruits and vegetables. ? Limit foods that are high in fat and  processed sugars, such as fried or sweet foods. General instructions   Return to your normal activities as told by your health care provider. Ask your health care provider what activities are safe for you.  Work with your health care provider to manage side effects of treatment.  Maintain a healthy diet.  Do not use any products that contain nicotine or tobacco, such as cigarettes, e-cigarettes, and chewing tobacco. If you need help quitting, ask your health care provider.  Do not use alcohol.  Keep all follow-up visits as told by your health care provider. This is important. Contact a health care provider if you have:  Pain that does not get better with medicine or gets worse.  Bleeding from the floor of your mouth, tongue, lining of the mouth, or lip.  New numbness in your mouth.  New or increasing swelling in your mouth.  Difficulty swallowing.  New symptoms. Get help right away if you have:  Severe pain in your tongue, lip, neck, or the lining of your mouth.  Difficulty breathing. Summary  Oral cancer occurs when cells on the floor of the mouth, tongue, lining of the mouth, or lips become abnormal and grow out of control.  Smoking and drinking alcohol are two major risk factors for this cancer.  Symptoms include lumps or opens sores, numbness, bleeding, drooling, or pain.  Treatment depends on the stage of the cancer.  Follow your health care provider's instructions after diagnosis. This information is not intended to replace advice given to you by your health care provider. Make sure you discuss any questions you have with your health care provider. Document Revised: 03/14/2018 Document Reviewed: 03/14/2018 Elsevier Patient Education  [place]. Chronic Obstructive Pulmonary Disease Chronic obstructive pulmonary disease (COPD) is a long-term (chronic) lung problem. When you have COPD, it is hard for air to get in and out of your lungs. Usually the  condition gets worse over time, and your lungs will never return to normal. There are things you can do to keep yourself as healthy as possible.  Your doctor may treat your condition with: ? Medicines. ? Oxygen. ? Lung surgery.  Your doctor may also recommend: Rehabilitation. This includes steps to make your body work better. It may involve a team of specialisTramadol tablets What is this medicine? TRAMADOL (TRA ma dole) is a pain reliever. It is used to treat moderate to severe pain in adults. This medicine may be used for other purposes; ask your health care provider or pharmacist if you have questions. COMMON BRAND NAME(S): Ultram What should I tell my health care provider before I take this medicine? They need to know if you have any of these conditions:  brain tumor  depression  drug abuse or addiction  head injury  if you frequently drink alcohol containing drinks  kidney disease or trouble passing urine  liver disease  lung disease, asthma, or breathing problems  seizures or epilepsy  suicidal thoughts, plans, or attempt; a previous suicide attempt by  you or a family member  an unusual or allergic reaction to tramadol, codeine, other medicines, foods, dyes, or preservatives  pregnant or trying to get pregnant  breast-feeding How should I use this medicine? Take this medicine by mouth with a full glass of water. Follow the directions on the prescription label. You can take it with or without food. If it upsets your stomach, take it with food. Do not take your medicine more often than directed. A special MedGuide will be given to you by the pharmacist with each prescription and refill. Be sure to read this information carefully each time. Talk to your pediatrician regarding the use of this medicine in children. Special care may be needed. Overdosage: If you think you have taken too much of this medicine contact a poison control center or emergency room at  once. NOTE: This medicine is only for you. Do not share this medicine with others. What if I miss a dose? If you miss a dose, take it as soon as you can. If it is almost time for your next dose, take only that dose. Do not take double or extra doses. What may interact with this medicine? Do not take this medication with any of the following medicines:  MAOIs like Carbex, Eldepryl, Marplan, Nardil, and Parnate This medicine may also interact with the following medications:  alcohol  antihistamines for allergy, cough and cold  certain medicines for anxiety or sleep  certain medicines for depression like amitriptyline, fluoxetine, sertraline  certain medicines for migraine headache like almotriptan, eletriptan, frovatriptan, naratriptan, rizatriptan, sumatriptan, zolmitriptan  certain medicines for seizures like carbamazepine, oxcarbazepine, phenobarbital, primidone  certain medicines that treat or prevent blood clots like warfarin  digoxin  furazolidone  general anesthetics like halothane, isoflurane, methoxyflurane, propofol  linezolid  local anesthetics like lidocaine, pramoxine, tetracaine  medicines that relax muscles for surgery  other narcotic medicines for pain or cough  phenothiazines like chlorpromazine, mesoridazine, prochlorperazine, thioridazine  procarbazine This list may not describe all possible interactions. Give your health care provider a list of all the medicines, herbs, non-prescription drugs, or dietary supplements you use. Also tell them if you smoke, drink alcohol, or use illegal drugs. Some items may interact with your medicine. What should I watch for while using this medicine? Tell your health care provider if your pain does not go away, if it gets worse, or if you have new or a different type of pain. You may develop tolerance to this drug. Tolerance means that you will need a higher dose of the drug for pain relief. Tolerance is normal and is  expected if you take this drug for a long time. Do not suddenly stop taking your drug because you may develop a severe reaction. Your body becomes used to the drug. This does NOT mean you are addicted. Addiction is a behavior related to getting and using a drug for a nonmedical reason. If you have pain, you have a medical reason to take pain drug. Your health care provider will tell you how much drug to take. If your health care provider wants you to stop the drug, the dose will be slowly lowered over time to avoid any side effects. If you take other drugs that also cause drowsiness like other narcotic pain drugs, benzodiazepines, or other drugs for sleep, you may have more side effects. Give your health care provider a list of all drugs you use. He or she will tell you how much drug to take. Do not take  more drug than directed. Get emergency help right away if you have trouble breathing or are unusually tired or sleepy. Talk to your health care provider about naloxone and how to get it. Naloxone is an emergency drug used for an opioid overdose. An overdose can happen if you take too much opioid. It can also happen if an opioid is taken with some other drugs or substances, like alcohol. Know the symptoms of an overdose, like trouble breathing, unusually tired or sleepy, or not being able to respond or wake up. Make sure to tell caregivers and close contacts where it is stored. Make sure they know how to use it. After naloxone is given, you must get emergency help right away. Naloxone is a temporary treatment. Repeat doses may be needed. This drug may cause serious skin reactions. They can happen weeks to months after starting the drug. Contact your health care provider right away if you notice fevers or flu-like symptoms with a rash. The rash may be red or purple and then turn into blisters or peeling of the skin. Or, you might notice a red rash with swelling of the face, lips, or lymph nodes in your neck or  under your arms. You may get drowsy or dizzy. Do not drive, use machinery, or do anything that needs mental alertness until you know how this drug affects you. Do not stand up or sit up quickly, especially if you are an older patient. This reduces the risk of dizzy or fainting spells. Alcohol may interfere with the effect of this drug. Avoid alcoholic drinks. This drug will cause constipation. If you do not have a bowel movement for 3 days, call your health care provider. Your mouth may get dry. Chewing sugarless gum or sucking hard candy and drinking plenty of water may help. Contact your health care provider if the problem does not go away or is severe. What side effects may I notice from receiving this medicine? Side effects that you should report to your doctor or health care professional as soon as possible:  allergic reactions like skin rash, itching or hives, swelling of the face, lips, or tongue  breathing problems  confusion  redness, blistering, peeling or loosening of the skin, including inside the mouth  seizures  signs and symptoms of low blood pressure like dizziness; feeling faint or lightheaded, falls; unusually weak or tired  trouble passing urine or change in the amount of urine Side effects that usually do not require medical attention (report to your doctor or health care professional if they continue or are bothersome):  constipation  dry mouth  nausea, vomiting  tiredness This list may not describe all possible side effects. Call your doctor for medical advice about side effects. You may report side effects to FDA at 1-800-FDA-1088. Where should I keep my medicine? Keep out of the reach of children. This medicine may cause accidental overdose and death if it taken by other adults, children, or pets. Mix any unused medicine with a substance like cat litter or coffee grounds. Then throw the medicine away in a sealed container like a sealed bag or a coffee can with  a lid. Do not use the medicine after the expiration date. Store at room temperature between 15 and 30 degrees C (59 and 86 degrees F). NOTE: This sheet is a summary. It may not cover all possible information. If you have questions about this medicine, talk to your doctor, pharmacist, or health care provider.  2020 Elsevier/Gold Standard (2019-04-10  13:08:25) ? ts. ? Quitting smoking, if you smoke. ? Exercise and changes to your diet. ? Comfort measures (palliative care). Follow these instructions at home: Medicines  Take over-the-counter and prescription medicines only as told by your doctor.  Talk to your doctor before taking any cough or allergy medicines. You may need to avoid medicines that cause your lungs to be dry. Lifestyle  If you smoke, stop. Smoking makes the problem worse. If you need help quitting, ask your doctor.  Avoid being around things that make your breathing worse. This may include smoke, chemicals, and fumes.  Stay active, but remember to rest as well.  Learn and use tips on how to relax.  Make sure you get enough sleep. Most adults need at least 7 hours of sleep every night.  Eat healthy foods. Eat smaller meals more often. Rest before meals. Controlled breathing Learn and use tips on how to control your breathing as told by your doctor. Try:  Breathing in (inhaling) through your nose for 1 second. Then, pucker your lips and breath out (exhale) through your lips for 2 seconds.  Putting one hand on your belly (abdomen). Breathe in slowly through your nose for 1 second. Your hand on your belly should move out. Pucker your lips and breathe out slowly through your lips. Your hand on your belly should move in as you breathe out.  Controlled coughing Learn and use controlled coughing to clear mucus from your lungs. Follow these steps: 1. Lean your head a little forward. 2. Breathe in deeply. 3. Try to hold your breath for 3 seconds. 4. Keep your mouth slightly  open while coughing 2 times. 5. Spit any mucus out into a tissue. 6. Rest and do the steps again 1 or 2 times as needed. General instructions  Make sure you get all the shots (vaccines) that your doctor recommends. Ask your doctor about a flu shot and a pneumonia shot.  Use oxygen therapy and pulmonary rehabilitation if told by your doctor. If you need home oxygen therapy, ask your doctor if you should buy a tool to measure your oxygen level (oximeter).  Make a COPD action plan with your doctor. This helps you to know what to do if you feel worse than usual.  Manage any other conditions you have as told by your doctor.  Avoid going outside when it is very hot, cold, or humid.  Avoid people who have a sickness you can catch (contagious).  Keep all follow-up visits as told by your doctor. This is important. Contact a doctor if:  You cough up more mucus than usual.  There is a change in the color or thickness of the mucus.  It is harder to breathe than usual.  Your breathing is faster than usual.  You have trouble sleeping.  You need to use your medicines more often than usual.  You have trouble doing your normal activities such as getting dressed or walking around the house. Get help right away if:  You have shortness of breath while resting.  You have shortness of breath that stops you from: ? Being able to talk. ? Doing normal activities.  Your chest hurts for longer than 5 minutes.  Your skin color is more blue than usual.  Your pulse oximeter shows that you have low oxygen for longer than 5 minutes.  You have a fever.  You feel too tired to breathe normally. Summary  Chronic obstructive pulmonary disease (COPD) is a long-term lung problem.  The way your lungs work will never return to normal. Usually the condition gets worse over time. There are things you can do to keep yourself as healthy as possible.  Take over-the-counter and prescription medicines only  as told by your doctor.  If you smoke, stop. Smoking makes the problem worse. This information is not intended to replace advice given to you by your health care provider. Make sure you discuss any questions you have with your health care provider. Document Revised: 08/13/2017 Document Reviewed: 10/05/2016 Elsevier Patient Education  [place]. Albuterol inhalation aerosol What is this medicine? ALBUTEROL (al [place]) is a bronchodilator. It helps open up the airways in your lungs to make it easier to breathe. This medicine is used to treat and to prevent bronchospasm. This medicine may be used for other purposes; ask your health care provider or pharmacist if you have questions. COMMON BRAND NAME(S): Proair HFA, Proventil, Proventil HFA, Respirol, Ventolin, Ventolin HFA What should I tell my health care provider before I take this medicine? They need to know if you have any of the following conditions:  diabetes  heart disease or irregular heartbeat  high blood pressure  pheochromocytoma  seizures  thyroid disease  an unusual or allergic reaction to albuterol, levalbuterol, other medicines, foods, dyes, or preservatives  pregnant or trying to get pregnant  breast-feeding How should I use this medicine? This medicine is for inhalation through the mouth. Follow the directions on your prescription label. Take your medicine at regular intervals. Do not use more often than directed. Make sure that you are using your inhaler correctly. Ask your doctor or health care provider if you have any questions. Talk to your pediatrician regarding the use of this medicine in children. While this drug may be prescribed for children as young as 4 years for selected conditions, precautions do apply. Overdosage: If you think you have taken too much of this medicine contact a poison control center or emergency room at once. NOTE: This medicine is only for you. Do not share this medicine  with others. What if I miss a dose? If you miss a dose, use it as soon as you can. If it is almost time for your next dose, use only that dose. Do not use double or extra doses. What may interact with this medicine?  anti-infectives like chloroquine and pentamidine  caffeine  cisapride  diuretics  medicines for colds  medicines for depression or for emotional or psychotic conditions  medicines for weight loss including some herbal products  methadone  some antibiotics like clarithromycin, erythromycin, levofloxacin, and linezolid  some heart medicines  steroid hormones like dexamethasone, cortisone, hydrocortisone  theophylline  thyroid hormones This list may not describe all possible interactions. Give your health care provider a list of all the medicines, herbs, non-prescription drugs, or dietary supplements you use. Also tell them if you smoke, drink alcohol, or use illegal drugs. Some items may interact with your medicine. What should I watch for while using this medicine? Tell your doctor or health care professional if your symptoms do not improve. Do not use extra albuterol. If your asthma or bronchitis gets worse while you are using this medicine, call your doctor right away. If your mouth gets dry try chewing sugarless gum or sucking hard candy. Drink water as directed. What side effects may I notice from receiving this medicine? Side effects that you should report to your doctor or health care professional as soon as possible:  allergic  reactions like skin rash, itching or hives, swelling of the face, lips, or tongue  breathing problems  chest pain  feeling faint or lightheaded, falls  high blood pressure  irregular heartbeat  fever  muscle cramps or weakness  pain, tingling, numbness in the hands or feet  vomiting Side effects that usually do not require medical attention (report to your doctor or health care professional if they continue or are  bothersome):  changes in taste  cough  dry mouth  headache  nervousness or trembling  stomach upset  stuffy or runny nose  throat irritation  trouble sleeping This list may not describe all possible side effects. Call your doctor for medical advice about side effects. You may report side effects to FDA at 1-800-FDA-1088. Where should I keep my medicine? Keep out of the reach of children. Store Proventil HFA and ProAir HFA at room temperature between 15 and 25 degrees C (59 and 77 degrees F). Store Ventolin HFA at room temperature between 20 and 25 degrees C (68 and 77 degrees F); it may be stored between 15 and 30 degrees C (59 and 86 degrees F) on occasion. The contents are under pressure and may burst when exposed to heat or flame. Do not freeze. This medicine does not work as well if it is too cold. Throw away the inhaler when the dose counter displays "0" or after the expiration date on the package, whichever comes first. Ventolin HFA should be thrown away 12 months after removing it from the foil pouch. NOTE: This sheet is a summary. It may not cover all possible information. If you have questions about this medicine, talk to your doctor, pharmacist, or health care provider.  2020 Elsevier/Gold Standard (2018-12-15 12:46:54) Budesonide; Formoterol Inhalation What is this medicine? BUDESONIDE; FORMOTEROL (byoo DES oh nide; for [place]) inhalation is a combination of 2 medicines that decrease inflammation and help to open up the airways in your lungs. It is used to treat asthma. It is also used to treat chronic obstructive pulmonary disease (COPD), including chronic bronchitis or emphysema. Do NOT use for an acute asthma or COPD attack. This medicine may be used for other purposes; ask your health care provider or pharmacist if you have questions. COMMON BRAND NAME(S): Symbicort What should I tell my health care provider before I take this medicine? They need to know if you have  any of these conditions:  bone problems  diabetes  eye disease, vision problems  heart disease  high blood pressure  history of irregular heartbeat  immune system problems  infection  liver disease  pheochromocytoma  seizures  thyroid disease  an unusual or allergic reaction to budesonide, formoterol, other medicines, foods, dyes, or preservatives  pregnant or trying to get pregnant  breast-feeding How should I use this medicine? This medicine is inhaled through the mouth. Rinse your mouth with water after use. Make sure not to swallow the water. Follow the directions on your prescription label. Do not use more often than directed. Do not stop taking except on your doctor's advice. Make sure that you are using your inhaler correctly. Ask your doctor or health care provider if you have any questions. A special MedGuide will be given to you by the pharmacist with each prescription and refill. Be sure to read this information carefully each time. Talk to your pediatrician regarding the use of this medicine in children. While this drug may be prescribed for children as young as 18 years of age  for selected conditions, precautions do apply. Overdosage: If you think you have taken too much of this medicine contact a poison control center or emergency room at once. NOTE: This medicine is only for you. Do not share this medicine with others. What if I miss a dose? If you miss a dose, use it as soon as you can. If it is almost time for your next dose, use only that dose. Do not use double or extra doses. What may interact with this medicine? Do not take the medicine with any of the following medications:  cisapride  dofetilide  dronedarone  MAOIs like Marplan, Nardil, and Parnate  other medicines that contain long-acting beta-2 agonists (LABAs) like arfomoterol, formoterol, indacaterol, olodaterol, salmeterol, vilanterol  pimozide  procarbazine  thioridazine This  medicine may also interact with the following medications:  certain antibiotics like clarithromycin, telithromycin  certain antivirals for HIV or hepatitis  certain heart medicines like atenolol, metoprolol  certain medicines for blood pressure, heart disease, irregular heartbeat  certain medicines for depression, anxiety, or psychotic disturbances  certain medicines for fungal infections like ketoconazole, itraconazole  diuretics  grapefruit juice  mifepristone  other medicines that prolong the QT interval (an abnormal heart rhythm)  some vaccines  steroid medicines like prednisone or cortisone  stimulant medicines for attention disorders, weight loss, or to stay awake  theophylline This list may not describe all possible interactions. Give your health care provider a list of all the medicines, herbs, non-prescription drugs, or dietary supplements you use. Also tell them if you smoke, drink alcohol, or use illegal drugs. Some items may interact with your medicine. What should I watch for while using this medicine? Visit your health care professional for regular checks on your progress. Tell your health care professional if your symptoms do not start to get better or if they get worse. If your symptoms get worse or if you need your short-acting inhalers more often, call your doctor right away. This medicine may increase your risk of getting an infection. Tell your doctor or health care professional if you are around anyone with measles or chickenpox, or if you develop sores or blisters that do not heal properly. What side effects may I notice from receiving this medicine? Side effects that you should report to your doctor or health care professional as soon as possible:  allergic reactions like skin rash, itching or hives, swelling of the face, lips, or tongue  anxious  breathing problems  changes in vision, eye pain  muscle cramps or muscle pain  signs and symptoms of  a dangerous change in heartbeat or heart rhythm like chest pain; dizziness; fast or irregular heartbeat; palpitations; feeling faint or lightheaded, falls; breathing problems  signs and symptoms of high blood sugar such as being more thirsty or hungry or having to urinate more than normal. You may also feel very tired or have blurry vision  signs and symptoms of infection like fever; chills; cough; sore throat; pain or trouble passing urine  tremors  unusually weak or tired  white patches in the mouth or mouth sores Side effects that usually do not require medical attention (report these to your doctor or health care professional if they continue or are bothersome):  back pain  changes in taste  cough  diarrhea  runny or stuffy nose  upset stomach This list may not describe all possible side effects. Call your doctor for medical advice about side effects. You may report side effects to FDA at 1-800-FDA-1088.  Where should I keep my medicine? Keep out of the reach of children. Store in a dry place at room temperature between 20 and 25 degrees C (68 and 77 degrees F). Do not get the inhaler wet. Keep track of the number of doses used. Throw away the inhaler after using the marked number of inhalations or after the expiration date, whichever comes first. Do not burn or puncture canister. NOTE: This sheet is a summary. It may not cover all possible information. If you have questions about this medicine, talk to your doctor, pharmacist, or health care provider.  2020 Elsevier/Gold Standard (2019-04-17 15:50:03)

## 2019-11-13 NOTE — Progress Notes (Addendum)
Patient: Eugene Strong Male    DOB: [birth year]   69 y.o.   MRN: [id number] Visit Date: 11/13/2019  Today's Provider: Marcille Buffy, FNP   Chief Complaint  Patient presents with  . Transitions Of Care   Subjective:     HPI   Follow up for Anemia  The patient was last seen for this 1 months ago. Changes made at last visit include  Patient was referred to hematology, patient reports that he has a scheduled visit with oral surgeon Dr. Pandora Leiter at [facility] this afternoon.    He needs Tramadol 50 mg refill.   Appetite is better. He has gained weight. He is doing well with this.  Legs are weak.He is continuing his ensure drinks. He reports he actually feels much better than he did at [place] first visit with provider.   questionable rather he is a surgically candidate.   Denies any rectal bleeding.  Aspirin 23m and  Mens centrum vitamin  with iron he has been taking.   His Hemoglobin 1 month ago was 6.8, 6 days ago was 9.4. No treatment was started hematology, he has a complex case with multiple comorbidites.    He is a chronic smoker, smoking 4-5 cigarettes a day now. He has some shortness of breath with exertion, and can feel some chest congestion at time. Upon review his CT in December during hospitalization showed COPD changes in the chest. He denies any productive cough.Has occasional cough. No ill contacts or exposures. No loss of smell or taste   He denied any chest pain or any other cardiac symptoms.   Patient  denies any fever, chills, rash, chest pain, nausea, vomiting, or diarrhea.   Reviewed extensive recent history.  Seen by Dr. 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  [facility]  Review of note suggestive of extensive surgery needed; but given patient's multiple comorbidities/recent stroke CVA-significant concerns of his tolerance to major surgery.  Awaiting PET scan this week Macrocytic anemia #Macrocytic anemia-MCV 1 13-1 16; hemoglobin 7-8.  Unclear etiology.  Discussed multiple etiologies including but not limited to nutritional deficiency BZ00folic acid; liver disease and bone marrow problems.  #Discussed that if blood work is unremarkable-then would recommend bone marrow biopsy for evaluation-evaluate for primary bone marrow disorders..  Awaiting a PET scan on 2/26- [facility]  #Clinical squamous cell carcinoma of the left side of the tongue-work-up in progress PET scan as above.  S/p evaluation with oral surgery.  Questionable candidate for surgery-definitive radiation as alternative.  Again work-up in progress as above  #Cachexia/weight loss secondary to-on lead malignancy difficulty swallowing-as above  Thank you, Ms.Flinchum, FNP for allowing me to participate in the care of your pleasant patient. Please do not hesitate to contact me with questions or concerns in the interim.  #Get CBC CMP hepatitis studiesfolic acid LDH myeloma panel kappa lambda light chain ratio haptoglobin reticulocyte count; review of smear.  # DISPOSITION: # labs today # follow up in 2 weeks; no labs- Dr.B  Has follow up with Dr. 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 Patient not interested in surgery and potentially not a candidate; PET/CT scheduled for 2/26. Discussed at 2/22 tumor board.      PET scan on 11/10/2019 reviewed.  IMPRESSION:  1. Large hypermetabolic left tongue mass is consistent with known primary  malignancy.  2. Hypermetabolic left cervical malignancy is consistent with nodal  metastatic disease.  3. Small additional bilateral cervical nodes with uptake slightly greater  than blood pool are indeterminate for metastatic disease.  4. No evidence of metastatic disease in the chest, abdomen, or pelvis.  5. There is debris in the trachea and right greater than left mainstem  bronchi.  6. Severe three-vessel coronary artery calcifications.    ------------------------------------------------------------------------------------   Allergies  Allergen Reactions  . Codeine Rash  . Penicillins Rash     Current Outpatient Medications:  .  aspirin EC 81 MG EC tablet, Take 1 tablet (81 mg total) by mouth daily., Disp: 150 tablet, Rfl: 0 .  atorvastatin (LIPITOR) 80 MG tablet, Take 1 tablet (80 mg total) by mouth daily at 6 PM., Disp: 30 tablet, Rfl: 0 .  clopidogrel (PLAVIX) 75 MG tablet, Take 1 tablet (75 mg total) by mouth daily., Disp: 30 tablet, Rfl: 0 .  feeding supplement, ENSURE ENLIVE, (ENSURE ENLIVE) LIQD, Take 237 mLs by mouth 3 (three) times daily with meals., Disp: 237 mL, Rfl: 12 .  magic mouthwash SOLN, Take 15 mLs by mouth 4 (four) times daily., Disp: 600 mL, Rfl: 8 .  Multiple Vitamin (MULTIVITAMIN WITH MINERALS) TABS tablet, Take 1 tablet by mouth daily., Disp: 150 tablet, Rfl: 0 .  polyethylene glycol (MIRALAX / GLYCOLAX) 17 g packet, Take 17 g by mouth daily., Disp: 14 each, Rfl: 0 .  traMADol (ULTRAM) 50 MG tablet, Take 50 mg by mouth every 4 (four) hours as needed., Disp: , Rfl:  .  metFORMIN (GLUCOPHAGE) 500 MG tablet, Take 1 tablet (500 mg total) by mouth 2 (two)  times daily with a meal. (Patient not taking: Reported on 11/13/2019), Disp: 60 tablet, Rfl: 0  Review of Systems  Constitutional: Positive for appetite change, fatigue and unexpected weight change. Negative for activity change, chills, diaphoresis and fever.  HENT: Positive for dental problem and drooling. Negative for congestion, ear discharge, ear pain, facial swelling, hearing loss, mouth sores, nosebleeds, postnasal drip, rhinorrhea, sinus pressure, sinus pain, sneezing, sore throat, tinnitus, trouble swallowing and voice change.        Oral cancer   Eyes: Negative.   Respiratory: Positive for shortness of breath and wheezing. Negative for apnea, cough, choking, chest tightness and stridor.   Cardiovascular: Negative.  Negative for chest pain, palpitations and leg swelling.  Gastrointestinal: Positive for constipation. Negative for abdominal distention, abdominal pain, anal bleeding, blood in stool, diarrhea, nausea, rectal pain and vomiting.  Endocrine: Negative for cold intolerance, heat intolerance, polydipsia, polyphagia and polyuria.  Genitourinary: Positive for urgency. Negative for decreased urine volume, difficulty urinating, discharge, dysuria, enuresis, flank pain, frequency, genital sores, hematuria, penile pain, penile swelling, scrotal swelling and testicular pain.  Musculoskeletal: Positive for arthralgias and gait problem (weakness at times from stroke ). Negative for back pain, joint swelling, myalgias, neck pain and neck  stiffness.  Skin: Positive for color change (bruising ).  Neurological: Positive for weakness and numbness. Negative for dizziness, tremors, seizures, syncope, facial asymmetry, speech difficulty, light-headedness and headaches.  Hematological: Negative for adenopathy. Bruises/bleeds easily (bruises ).  Psychiatric/Behavioral: Negative.   All other systems reviewed and are negative.   Social History   Tobacco Use  . Smoking status: Current Every Day Smoker     Packs/day: 0.25  . Smokeless tobacco: Never Used  Substance Use Topics  . Alcohol use: Yes    Alcohol/week: 1.0 - 2.0 standard drinks    Types: 1 - 2 Cans of beer per week      Objective:   Today's Vitals   11/13/19 1023  BP: 132/72  Pulse: 82  Resp: 16  Temp: (!) 97.3 F (36.3 C)  TempSrc: Temporal  SpO2: 97%  Weight: 113 lb 3.2 oz (51.3 kg)   Body mass index is 14.53 kg/m. Pain 2/10 oral- with Ultram due to mass.   Physical Exam Vitals reviewed.  Constitutional:      General: He is not in acute distress.    Appearance: He is ill-appearing (thin and cachetic in apperance, pale skin ). He is not toxic-appearing or diaphoretic.  HENT:     Head: Normocephalic and atraumatic.     Jaw: There is normal jaw occlusion.     Right Ear: Hearing, tympanic membrane, ear canal and external ear normal.     Left Ear: Hearing, tympanic membrane, ear canal and external ear normal.     Nose: Mucosal edema present. No congestion or rhinorrhea.     Mouth/Throat:     Lips: Pink.     Mouth: Mucous membranes are moist.     Dentition: Abnormal dentition. Dental tenderness, gingival swelling and dental caries present.     Tongue: Lesions present.     Palate: Mass present.     Pharynx: Oropharynx is clear. Uvula midline.     Comments: Torus mandibular bony outgrowth from the inner aspect of the anterior mandible bilaterally, right greater than left mandibular.  Right-sided lesion  on the floor of mouth and superior deviation of the Tongue. Teeth alsp appear loose.   Eyes:     General: No scleral icterus.       Right eye: No discharge.        Left eye: No discharge.     Extraocular Movements: Extraocular movements intact.     Conjunctiva/sclera: Conjunctivae normal.     Pupils: Pupils are equal, round, and reactive to light.  Cardiovascular:     Rate and Rhythm: Normal rate and regular rhythm.     Heart sounds: Normal heart sounds.  Pulmonary:     Effort: Pulmonary effort is  normal. No respiratory distress.     Breath sounds: Normal air entry. No stridor. Examination of the right-upper field reveals wheezing and rhonchi. Examination of the left-upper field reveals wheezing and rhonchi. Examination of the right-middle field reveals rhonchi. Examination of the left-middle field reveals rhonchi. Examination of the right-lower field reveals rhonchi. Examination of the left-lower field reveals rhonchi. Wheezing (expiratory wheezing ) and rhonchi present. No rales.  Chest:     Chest wall: No mass, deformity or tenderness.  Abdominal:     General: Abdomen is flat. Bowel sounds are normal. There is no distension or abdominal bruit. There are no signs of injury.     Palpations: There is no shifting dullness, fluid wave, hepatomegaly, splenomegaly, mass or pulsatile mass.     Tenderness:  There is no abdominal tenderness. There is no right CVA tenderness, left CVA tenderness, guarding or rebound. Negative signs include Murphy's sign, Rovsing's sign, McBurney's sign, psoas sign and obturator sign.     Hernia: No hernia is present.  Genitourinary:    Comments: Patinet declined rectal/ testicular exam. Hemoccult declines.  Denies any changing concerns.  Musculoskeletal:        General: Normal range of motion.     Cervical back: Normal range of motion and neck supple. Tenderness (left carotid endarectomy 09/22/19 ) present. No rigidity.  Lymphadenopathy:     Cervical: No cervical adenopathy.     Upper Body:     Right upper body: No supraclavicular adenopathy.     Left upper body: No supraclavicular adenopathy.  Skin:    General: Skin is warm and dry.     Capillary Refill: Capillary refill takes less than 2 seconds.     Coloration: Skin is pale. Skin is not jaundiced.     Findings: Bruising (bilateral lower arms. ) present. No erythema, lesion or rash.  Neurological:     Mental Status: He is alert and oriented to person, place, and time.     GCS: GCS eye subscore is 4. GCS  verbal subscore is 5. GCS motor subscore is 6.     Cranial Nerves: Cranial nerves are intact. No cranial nerve deficit.     Sensory: Sensation is intact. No sensory deficit.     Motor: Weakness (lower extremities left weaker than right/ strength lower leg 4/5. right is 5/5. upper extremities 5/5 bilatearl strength. grip strength 5/5 bilateral. ) present. No tremor.     Coordination: Coordination is intact. Coordination normal. Finger-Nose-Finger Test normal.     Gait: Gait normal.     Deep Tendon Reflexes: Reflexes are normal and symmetric. Reflexes normal.     Reflex Scores:      Tricep reflexes are 2+ on the right side and 2+ on the left side.      Brachioradialis reflexes are 2+ on the right side and 2+ on the left side.      Achilles reflexes are 2+ on the right side and 2+ on the left side. Psychiatric:        Mood and Affect: Mood normal.        Behavior: Behavior normal.        Thought Content: Thought content normal.        Judgment: Judgment normal.      No results found for any visits on 11/13/19.     Assessment & Plan    1. Tongue cancer [facility])  He will see oral surgeon today 11/13/2019.  2. Chronic bronchitis, unspecified chronic bronchitis type ([place]) Suspect COPD given his CT scan in 2020 and his history of chronic smoking. He will be sent to pulmonary given his complex medical history.   Orders Placed This Encounter  Procedures  . DG Chest 2 View  . Ambulatory referral to Pulmonology    - DG Chest 2 View - Ambulatory referral to Pulmonology  3. Cachexia ([place])  Filed Weights   11/13/19 1023  Weight: 113 lb 3.2 oz (51.3 kg)   Weight at last visit with this provider was 103 lbs on 10/11/2019. His pain is under control in his mouth now with Ultram and this is causing him to be able to eat better. He is feeling better since gaining some weight. Anemia is also improving.   4. Cerebrovascular accident (CVA) due to embolism of cerebral  artery [facility])  He will  continue Aspirin 81 mg daily. No new neurological symptoms. Keep regular follow up's with the neurology office.   5. Wheezing - DG Chest 2 View - Ambulatory referral to Pulmonology  Meds ordered this encounter  Medications  . budesonide-formoterol (SYMBICORT) 80-4.5 MCG/ACT inhaler    Sig: Inhale 1 puff into the lungs 2 (two) times daily. Rinse mouth and spit after use at least three times with water.    Dispense:  1 Inhaler    Refill:  0    patient has oral tongue cancer -  . albuterol (VENTOLIN HFA) 108 (90 Base) MCG/ACT inhaler    Sig: Inhale 1-2 puffs into the lungs every 6 (six) hours as needed for wheezing or shortness of breath (only use  if needed for rescue inhaler).    Dispense:  18 g    Refill:  0  . traMADol (ULTRAM) 50 MG tablet    Sig: Take 1 tablet (50 mg total) by mouth every 6 (six) hours as needed for up to 8 days for moderate pain (only as needed for pain).    Dispense:  30 tablet    Refill:  0     6. Anemia, unspecified type Keep follow up with Dr. Patrecia Pace. Continue to eat and improve diet.  - Ambulatory referral to Pulmonology  7. Smoker Recommend cessation, patient has cut back on nicotine use but is not ready to quit at this time.  - Ambulatory referral to Pulmonology  8. History of left-sided carotid endarterectomy He has had recent follow up with Dr. Bunnie Domino office he will keep regular follow up's.  - Ambulatory referral to Pulmonology   Advised patient call the office or your primary care doctor for an appointment if no improvement within 72 hours or if any symptoms change or worsen at any time  Advised ER or urgent Care if after hours or on weekend. Call 911 for emergency symptoms at any time.Patinet verbalized understanding of all instructions given/reviewed and treatment plan and has no further questions or concerns at this time.    Addressed extensive list of chronic and acute medical problems today requiring 45 minutes reviewing his medical record,  counseling patient regarding his conditions and coordination of care.     The entirety of the information documented in the History of Present Illness, Review of Systems and Physical Exam were personally obtained by me. Portions of this information were initially documented by the  Certified Medical Assistant whose name is documented in [place] and reviewed by me for thoroughness and accuracy.  I have personally performed the exam and reviewed the chart and it is accurate to the best of my knowledge.  Haematologist has been used and any errors in dictation or transcription are unintentional.  Kelby Aline. [place], [facility]

## 2019-11-17 ENCOUNTER — Telehealth: Payer: Self-pay

## 2019-11-17 NOTE — Telephone Encounter (Signed)
So this is requesting a verbal order for OT/PT/skilled nursing, okay to approve?KW

## 2019-11-17 NOTE — Telephone Encounter (Signed)
I am not sure what needs to be done related to this message. I believe form was faxed back for therapy. Patient does need to continue therapy, happy to sign new forms if needed.

## 2019-11-17 NOTE — Telephone Encounter (Signed)
Verbal order has been approved. KW

## 2019-11-17 NOTE — Telephone Encounter (Signed)
Yes approve.  Thank you,  Laverna Peace MSN, AGNP-C, FNP-C  Family Nurse Practitioner  Adult Geriatric Nurse Practitioner

## 2019-11-17 NOTE — Telephone Encounter (Signed)
Copied from [place] [id number]. Topic: Quick Communication - Home Health Verbal Orders >> Nov 17, 2019 11:27 AM Virl Axe D wrote: Caller/Agency: Tressie Ellis Number: [phone] / Secure VM Requesting OT/PT/Skilled Nursing/Social Work/Speech Therapy: PT Frequency: 1 week 4 / Strengthening and Conditioning

## 2019-11-21 ENCOUNTER — Telehealth: Payer: Self-pay

## 2019-11-21 NOTE — Telephone Encounter (Signed)
-----   Message from Minette Headland, [place] sent at 11/17/2019  3:39 PM EST ----- Attempted to reach patient again and was unable to reach patient, voicemail box has not been set up yet. KW

## 2019-11-21 NOTE — Telephone Encounter (Signed)
Result Communications   Result Notes   Minette Headland [facility]  11/17/2019 3:39 PM EST    Attempted to reach patient again and was unable to reach patient, voicemail box has not been set up yet. KW   Minette Headland, [place]  11/16/2019 9:20 AM EST    Unable to reach patient at this time, voicemail box is not set up yet. KW   Doreen Beam, [place]  11/13/2019 9:49 PM EST    Chest x Ray shows mild emphysema changes. Keep plan discussed in office - referral to pulmonary was placed as well discussed in office, follow up as discussed and if any symptoms change or worsen at anytime.       After multiple attempts to reach patient with no response, I will mail letter to home.Marland Kitchen KW

## 2019-11-22 ENCOUNTER — Inpatient Hospital Stay: Payer: Medicare Other | Admitting: Internal Medicine

## 2019-11-24 ENCOUNTER — Telehealth: Payer: Self-pay | Admitting: *Deleted

## 2019-11-24 NOTE — Telephone Encounter (Signed)
Sounds appropriate given his extensive and painful upcoming oral surgery for malignancy. We can resume later if needed.  Thank you,  Laverna Peace MSN, AGNP-C, FNP-C  Family Nurse Practitioner  Adult Geriatric Nurse Practitioner

## 2019-11-24 NOTE — Telephone Encounter (Signed)
From PEC:  Eugene Strong (Patient) Eugene Strong (Patient) General - Inquiry  Reason for CRM: [name] from [place] [facility] called to inform Flinchum, [name] that pts sister will be putting pts physical therapy on hold until after surgery       Call back PO:[phone]

## 2019-11-24 NOTE — Telephone Encounter (Signed)
FYI. KW 

## 2019-12-06 MED ORDER — CLOPIDOGREL BISULFATE 75 MG PO TABS
75.00 | ORAL_TABLET | ORAL | Status: DC
Start: 2019-12-08 — End: 2019-12-06

## 2019-12-06 MED ORDER — OXYCODONE HCL 5 MG PO TABS
5.00 | ORAL_TABLET | ORAL | Status: DC
Start: ? — End: 2019-12-06

## 2019-12-06 MED ORDER — DEXTROSE 50 % IV SOLN
12.50 | INTRAVENOUS | Status: DC
Start: ? — End: 2019-12-06

## 2019-12-06 MED ORDER — GENERIC EXTERNAL MEDICATION
Status: DC
Start: ? — End: 2019-12-06

## 2019-12-06 MED ORDER — HYDRALAZINE HCL 20 MG/ML IJ SOLN
20.00 | INTRAMUSCULAR | Status: DC
Start: ? — End: 2019-12-06

## 2019-12-06 MED ORDER — GENERIC EXTERNAL MEDICATION
10.00 | Status: DC
Start: 2019-12-06 — End: 2019-12-06

## 2019-12-06 MED ORDER — ASPIRIN 81 MG PO CHEW
81.00 | CHEWABLE_TABLET | ORAL | Status: DC
Start: 2019-12-08 — End: 2019-12-06

## 2019-12-06 MED ORDER — GUAIFENESIN 100 MG/5ML PO SYRP
200.00 | ORAL_SOLUTION | ORAL | Status: DC
Start: ? — End: 2019-12-06

## 2019-12-06 MED ORDER — HEPARIN SODIUM (PORCINE) 5000 UNIT/ML IJ SOLN
5000.00 | INTRAMUSCULAR | Status: DC
Start: 2019-12-07 — End: 2019-12-06

## 2019-12-06 MED ORDER — POLYETHYLENE GLYCOL 3350 17 GM/SCOOP PO POWD
17.00 | ORAL | Status: DC
Start: 2019-12-08 — End: 2019-12-06

## 2019-12-06 MED ORDER — MELATONIN 3 MG PO TABS
6.00 | ORAL_TABLET | ORAL | Status: DC
Start: 2019-12-07 — End: 2019-12-06

## 2019-12-06 MED ORDER — GLYCOPYRROLATE 0.4 MG/2ML IJ SOLN
1.00 | INTRAMUSCULAR | Status: DC
Start: 2019-12-06 — End: 2019-12-06

## 2019-12-06 MED ORDER — MAGNESIUM HYDROXIDE 400 MG/5ML PO SUSP
15.00 | ORAL | Status: DC
Start: 2019-12-06 — End: 2019-12-06

## 2019-12-06 MED ORDER — ACETAMINOPHEN 325 MG PO TABS
975.00 | ORAL_TABLET | ORAL | Status: DC
Start: 2019-12-07 — End: 2019-12-06

## 2019-12-06 MED ORDER — NICOTINE 21 MG/24HR TD PT24
1.00 | MEDICATED_PATCH | TRANSDERMAL | Status: DC
Start: 2019-12-08 — End: 2019-12-06

## 2019-12-06 MED ORDER — CHLORHEXIDINE GLUCONATE 0.12 % MT SOLN
5.00 | OROMUCOSAL | Status: DC
Start: 2019-12-07 — End: 2019-12-06

## 2019-12-06 MED ORDER — LIDOCAINE HCL 1 % IJ SOLN
0.50 | INTRAMUSCULAR | Status: DC
Start: ? — End: 2019-12-06

## 2019-12-06 MED ORDER — GENERIC EXTERNAL MEDICATION
1.00 | Status: DC
Start: 2019-12-07 — End: 2019-12-06

## 2019-12-06 MED ORDER — GLUCAGON (RDNA) 1 MG IJ KIT
1.00 | PACK | INTRAMUSCULAR | Status: DC
Start: ? — End: 2019-12-06

## 2019-12-06 MED ORDER — SENNOSIDES-DOCUSATE SODIUM 8.6-50 MG PO TABS
2.00 | ORAL_TABLET | ORAL | Status: DC
Start: 2019-12-07 — End: 2019-12-06

## 2019-12-06 MED ORDER — LABETALOL HCL 5 MG/ML IV SOLN
10.00 | INTRAVENOUS | Status: DC
Start: ? — End: 2019-12-06

## 2019-12-06 MED ORDER — ATORVASTATIN CALCIUM 40 MG PO TABS
40.00 | ORAL_TABLET | ORAL | Status: DC
Start: 2019-12-08 — End: 2019-12-06

## 2019-12-06 MED ORDER — THIAMINE HCL 100 MG PO TABS
100.00 | ORAL_TABLET | ORAL | Status: DC
Start: 2019-12-07 — End: 2019-12-06

## 2019-12-06 MED ORDER — ONDANSETRON HCL 4 MG/2ML IJ SOLN
4.00 | INTRAMUSCULAR | Status: DC
Start: ? — End: 2019-12-06

## 2019-12-06 MED ORDER — NALOXONE HCL 0.4 MG/ML IJ SOLN
0.40 | INTRAMUSCULAR | Status: DC
Start: ? — End: 2019-12-06

## 2019-12-07 MED ORDER — GLYCOPYRROLATE 0.4 MG/2ML IJ SOLN
1.00 | INTRAMUSCULAR | Status: DC
Start: 2019-12-07 — End: 2019-12-07

## 2019-12-07 MED ORDER — GENERIC EXTERNAL MEDICATION
20.00 | Status: DC
Start: 2019-12-07 — End: 2019-12-07

## 2019-12-07 MED ORDER — OXYCODONE HCL 5 MG/5ML PO SOLN
5.00 | ORAL | Status: DC
Start: 2019-12-07 — End: 2019-12-07

## 2019-12-07 MED ORDER — LABETALOL HCL 5 MG/ML IV SOLN
20.00 | INTRAVENOUS | Status: DC
Start: ? — End: 2019-12-07

## 2019-12-13 ENCOUNTER — Ambulatory Visit
Admission: RE | Admit: 2019-12-13 | Discharge: 2019-12-13 | Disposition: A | Discharge status: Home / Self Care | Payer: Medicare Other | Source: Ambulatory Visit | Attending: Radiation Oncology | Admitting: Radiation Oncology

## 2019-12-13 ENCOUNTER — Inpatient Hospital Stay: Payer: Medicare Other | Admitting: Internal Medicine

## 2019-12-14 DEATH — deceased

## 2019-12-22 ENCOUNTER — Ambulatory Visit: Payer: Medicare Other | Admitting: Adult Health

## 2020-02-06 ENCOUNTER — Encounter (INDEPENDENT_AMBULATORY_CARE_PROVIDER_SITE_OTHER): Payer: Medicare Other

## 2020-02-06 ENCOUNTER — Ambulatory Visit (INDEPENDENT_AMBULATORY_CARE_PROVIDER_SITE_OTHER): Payer: Medicare Other | Admitting: Vascular Surgery

## 2021-12-03 IMAGING — MR MR HEAD W/O CM
10 of 12 series · 31 of 48 positions shown · non-contrast
Comparison: Prior CT from earlier the same day.

CLINICAL DATA: Initial evaluation for focal neural deficit,
possible stroke.

EXAM:
MRI HEAD WITHOUT CONTRAST
MRA HEAD WITHOUT CONTRAST
TECHNIQUE: Multiplanar, multiecho pulse sequences of the brain and surrounding
structures were obtained without intravenous contrast. Angiographic
images of the head were obtained using MRA technique without
contrast.

[Series 5: ax dwi_tracew · axial · 3.0mm · 0.60mm/px · z∈[-85,+70]mm · 3 of 48 slices shown]
[im 1/48]
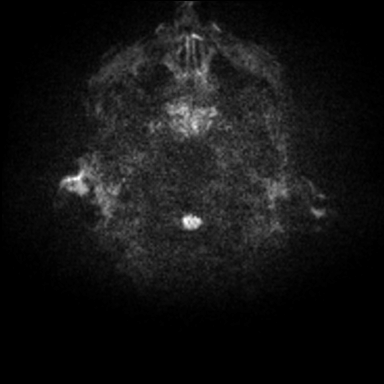
[im 24/48]
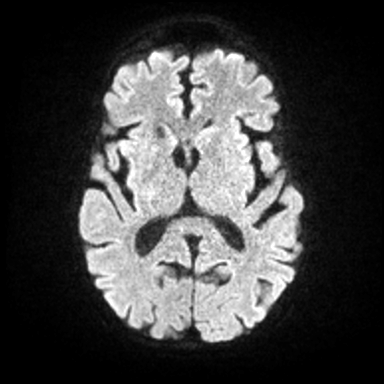
[im 48/48]
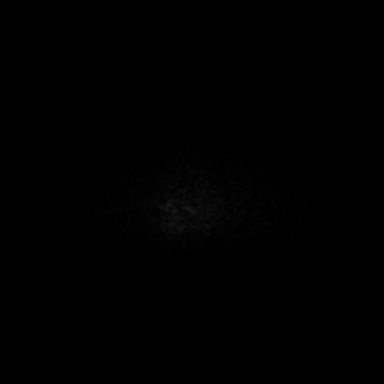

[Series 6: ax dwi_adc · axial · 3.0mm · 0.60mm/px · z∈[-85,+66]mm · 3 of 47 slices shown]
[im 1/47]
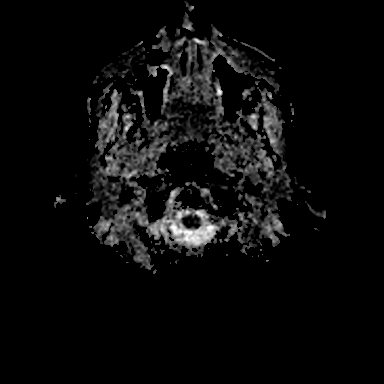
[im 24/47]
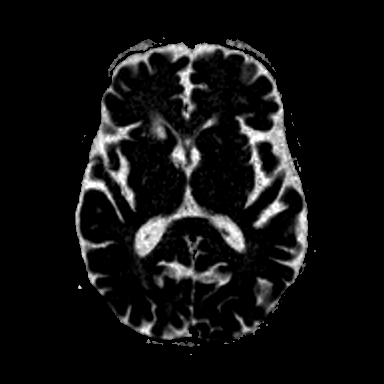
[im 47/47]
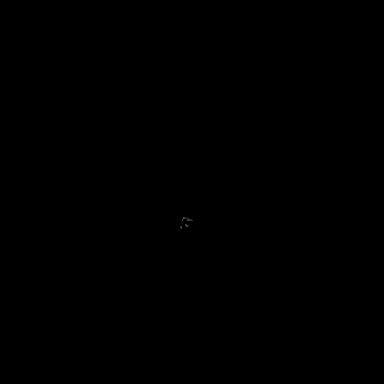

[Series 7: cor dwi_tracew · coronal · 5.0mm · 0.60mm/px · 2 of 38 slices shown]
[im 1/38]
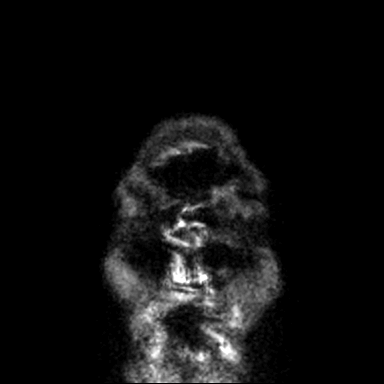
[im 38/38]
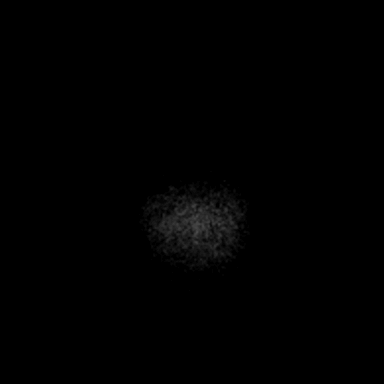

[Series 8: cor dwi_adc · coronal · 5.0mm · 0.60mm/px · 2 of 37 slices shown]
[im 1/37]
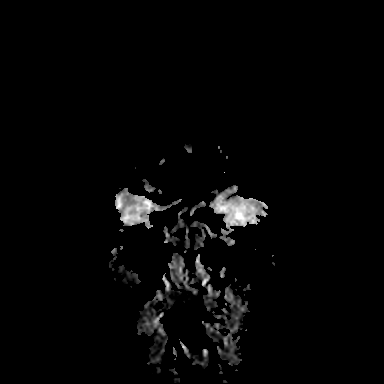
[im 37/37]
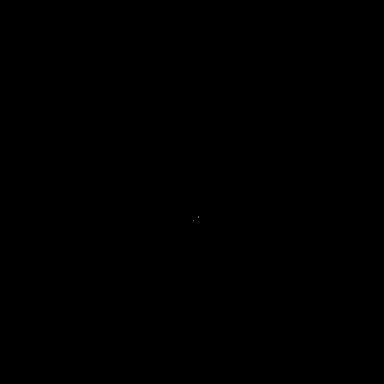

[Series 9: tof_cs_(id) · axial · 0.5mm · 0.48mm/px · z∈[-72,-22]mm · 5 of 200 slices shown]
[im 1/200]
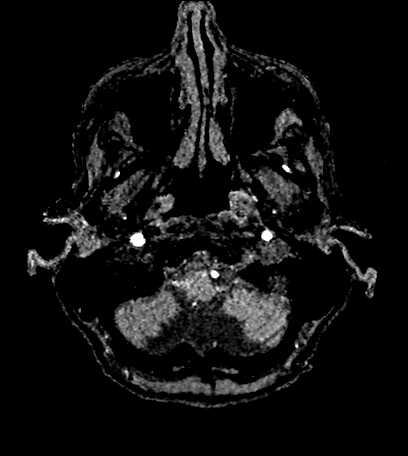
[im 37/200]
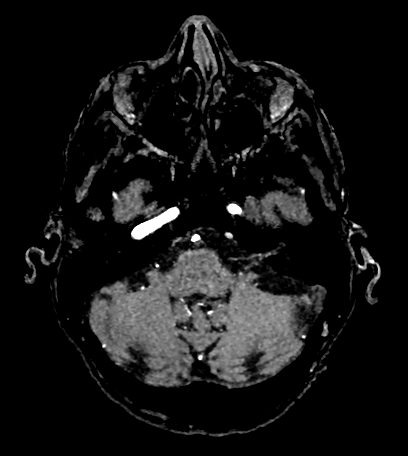
[im 55/200]
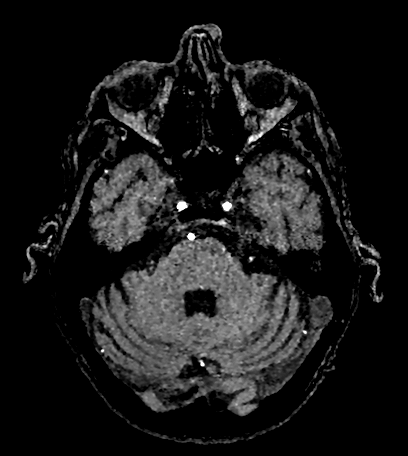
[im 91/200]
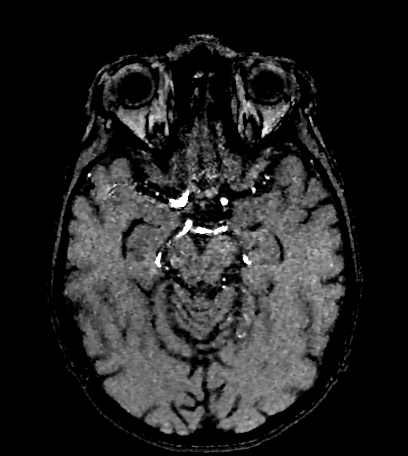
[im 109/200]
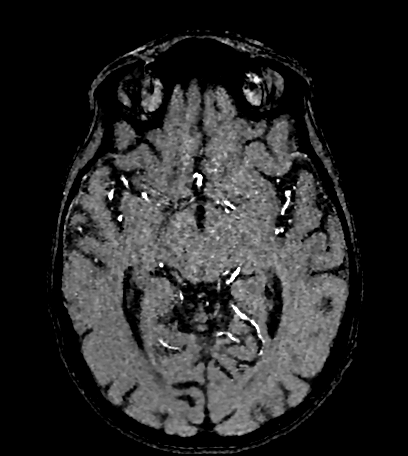

[Series 14: T1 · sagittal · 5.0mm · 0.62mm/px · 1 of 23 slices shown (1 of 2)]
[im 1/23]
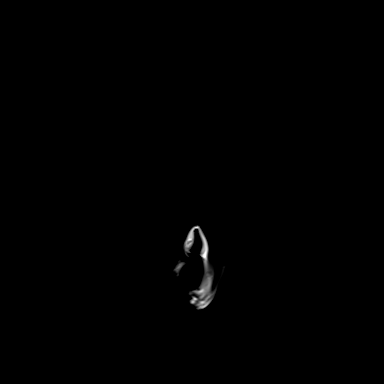

[Series 15: T2 · axial · 5.0mm · 0.53mm/px · z∈[-80,+64]mm · 2 of 25 slices shown (1 of 2)]
[im 1/25]
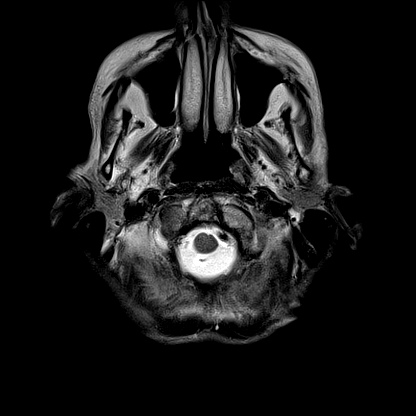
[im 25/25]
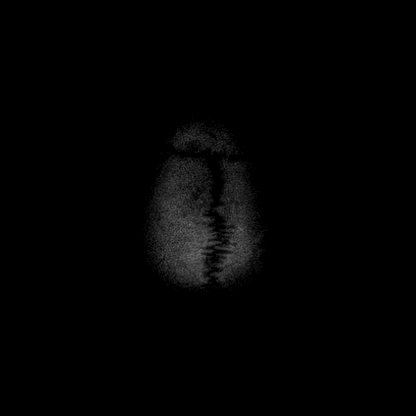

[Series 20: FLAIR · axial · 3.0mm · 0.53mm/px · z∈[-89,+73]mm · 3 of 55 slices shown]
[im 1/55]
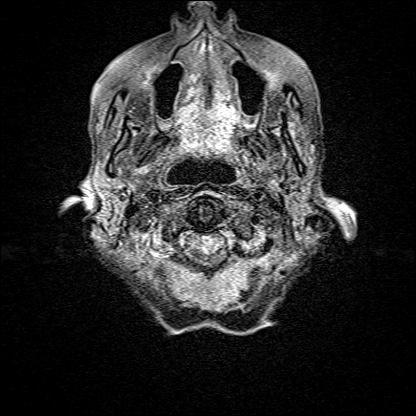
[im 28/55]
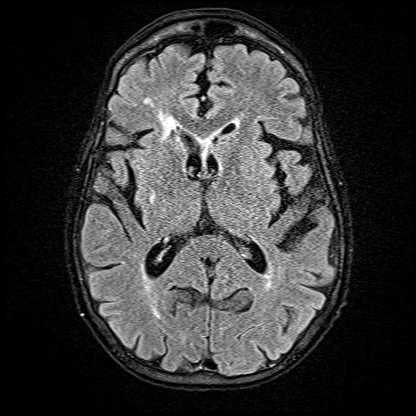
[im 55/55]
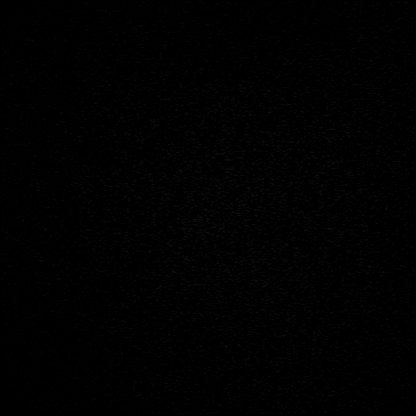

[Series 21: T1 · axial · 1.0mm · 0.98mm/px · z∈[-87,+72]mm · 8 of 160 slices shown (2 of 2)]
[im 1/160]
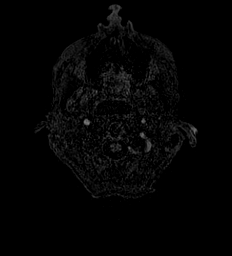
[im 18/160]
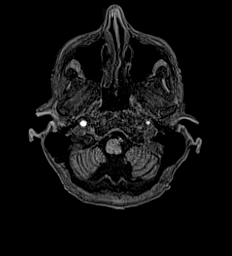
[im 54/160]
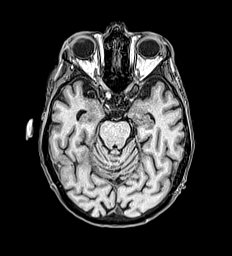
[im 71/160]
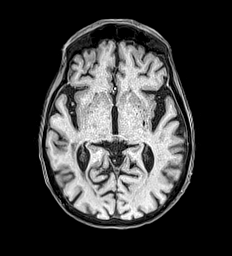
[im 89/160]
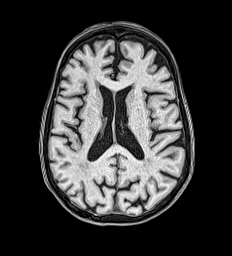
[im 107/160]
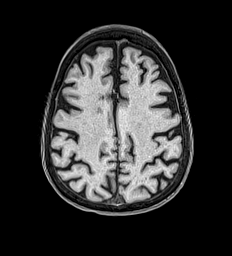
[im 142/160]
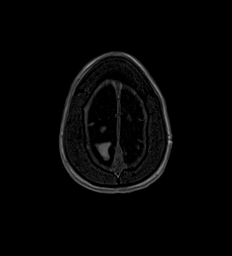
[im 160/160]
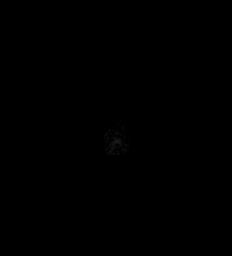

[Series 22: T2 · coronal · 5.0mm · 0.57mm/px · 2 of 29 slices shown (2 of 2)]
[im 1/29]
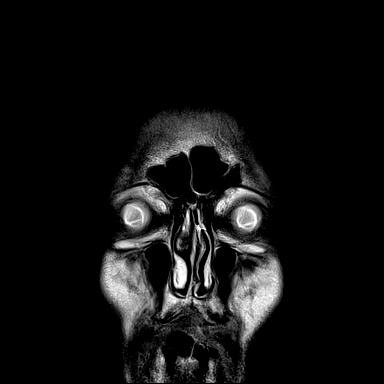
[im 29/29]
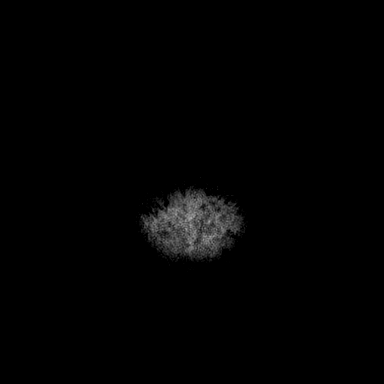

[31 of 48 positions shown; findings below may reference images not displayed]

FINDINGS: MRI HEAD FINDINGS

Brain: Diffuse prominence of the CSF containing spaces compatible
with generalized age-related cerebral atrophy. Patchy T2/FLAIR
hyperintensity within the periventricular deep white matter both
cerebral hemispheres most consistent with chronic small vessel
ischemic disease, mild to moderate nature. Few scattered
superimposed remote lacunar infarcts seen involving the bilateral
thalami, basal ganglia, and hemispheric cerebral white matter. Small
remote cortical/subcortical infarct noted involving the anterior
right frontal lobe (series 20, image 32).

Punctate 3 mm focus of restricted diffusion seen involving the
cortex of the left parietal lobe, consistent with a small acute
ischemic infarct (series 5, image 36). Additional faint diffusion
abnormality seen involving the cortex of the left occipital lobe
(series 5, image 22), acute to subacute in appearance. No associated
hemorrhage or mass effect. No other abnormal foci of restricted
diffusion to suggest acute or subacute ischemia. Gray-white matter
differentiation otherwise maintained. No acute intracranial
hemorrhage. Single subcentimeter focus of susceptibility artifact
noted within left parietal lobe, consistent with a small
microhemorrhage, likely small vessel related.

No mass lesion, midline shift or mass effect. No hydrocephalus. No
extra-axial fluid collection. Pituitary gland suprasellar region
within normal limits. Midline structures intact and normal.

Vascular: Major intracranial vascular flow voids are maintained.

Skull and upper cervical spine: Craniocervical junction within
normal limits. Bone marrow signal intensity normal. No scalp soft
tissue abnormality.

Sinuses/Orbits: Globes and orbital soft tissues within normal
limits. Paranasal sinuses are largely clear. Right-to-left nasal
septal deviation with associated concha bullosa noted. 8 mm
well-circumscribed T2 hyperintense nodule at the left nasal bridge,
indeterminate (series 15, image 7).

Other: No mastoid effusion.  Inner ear structures grossly normal.

MRA HEAD FINDINGS

ANTERIOR CIRCULATION:

Visualized distal cervical segments of the internal carotid arteries
are widely patent with symmetric antegrade flow. Petrous segments
widely patent. Multifocal atheromatous irregularity throughout the
cavernous/supraclinoid ICAs without high-grade flow-limiting
stenosis, slightly worse on the right. ICA termini well perfused.
Right A1 widely patent. Short-segment severe stenosis noted at the
proximal left M1 segment (series 9, image 106). Normal anterior
communicating artery. Anterior cerebral arteries widely patent to
their distal aspects without high-grade stenosis. No M1 stenosis or
occlusion. Normal MCA bifurcations. Distal MCA branches well
perfused and symmetric, although demonstrate diffuse small vessel
atheromatous irregularity.

POSTERIOR CIRCULATION:

Dominant left vertebral artery widely patent to the vertebrobasilar
junction. Patent left PICA. Right vertebral artery diffusely
hypoplastic and not well seen, although there is flow related signal
within the distal right V4 segment with perfusion of the right PICA.
Basilar widely patent to its distal aspect without stenosis.
Superior cerebral arteries patent bilaterally. Left PCA is supplied
primarily via the basilar. Right PCA supplied via the basilar as
well as a robust right posterior communicating artery. Multifocal
atheromatous irregularity throughout both PCAs, which are both
patent to their distal aspects. Short-segment severe distal right P3
stenosis noted (series 7704, image 6).

No intracranial aneurysm or other vascular abnormality.
IMPRESSION: MRI HEAD IMPRESSION:

1. Few tiny subcentimeter acute to early subacute cortical infarcts
involving the left parietal and occipital lobes as above. No
associated hemorrhage.
2. Underlying age-related cerebral atrophy with moderate chronic
microvascular ischemic disease.
3. 8 mm nodule involving the skin of the left nasal bridge,
indeterminate. Correlation with physical exam recommended.

MRA HEAD IMPRESSION:

1. No large vessel occlusion.
2. Scattered intracranial atherosclerotic change with resultant
severe left A1 and distal right P3 stenoses. No other proximal
high-grade or correctable stenosis.

## 2021-12-04 IMAGING — US US CAROTID DUPLEX BILAT
1 series · 13 of 24 positions shown · non-contrast
Comparison: None.

CLINICAL DATA: Left-sided weakness and cerebral infarction.

EXAM:
BILATERAL CAROTID DUPLEX ULTRASOUND
TECHNIQUE: Gray scale imaging, color Doppler and duplex ultrasound were
performed of bilateral carotid and vertebral arteries in the neck.

[Series 1: us carotid duplex bilat · 13 of 69 slices shown]
[im 1/69]
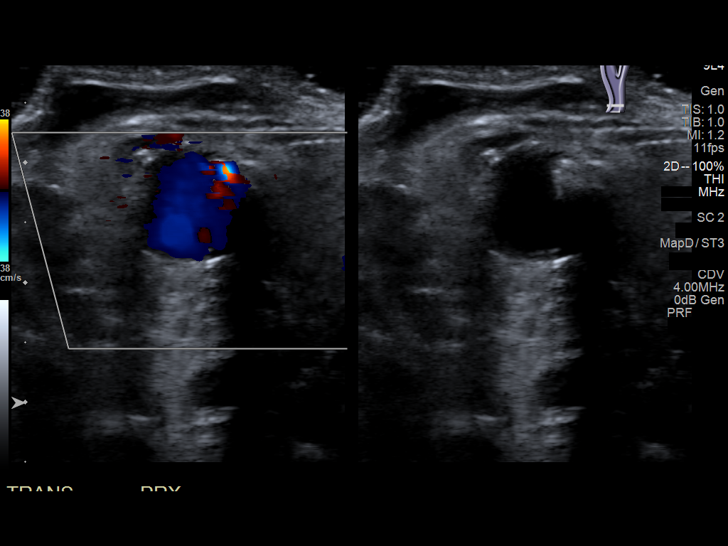
[im 6/69]
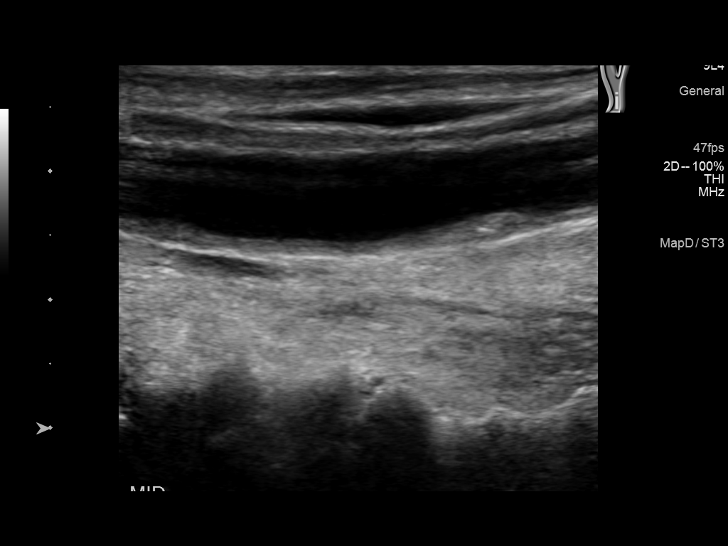
[im 12/69]
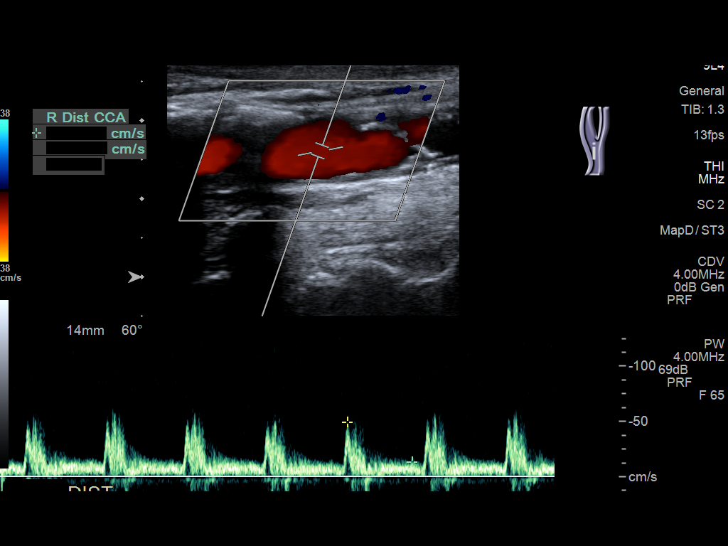
[im 18/69]
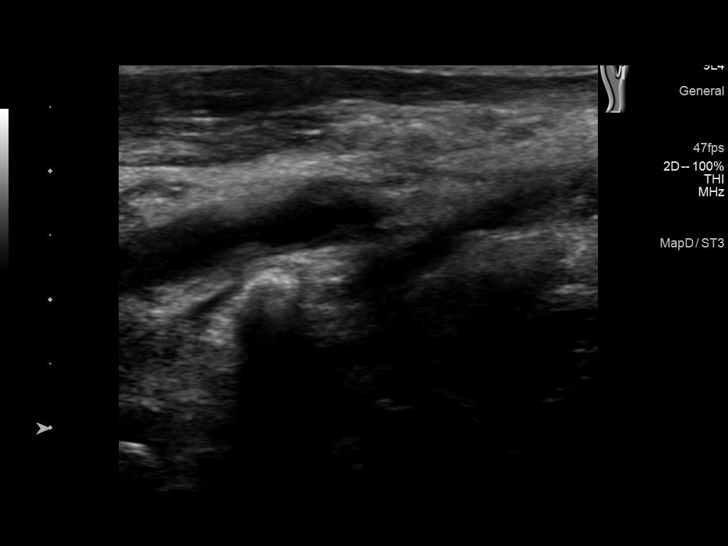
[im 24/69]
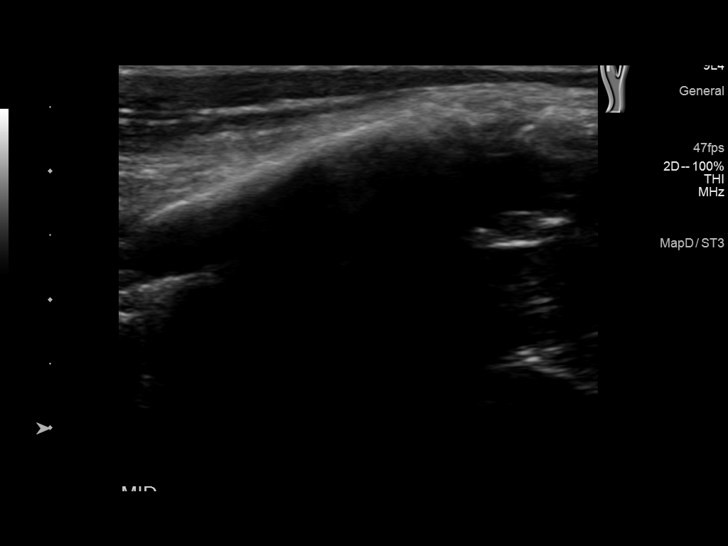
[im 30/69]
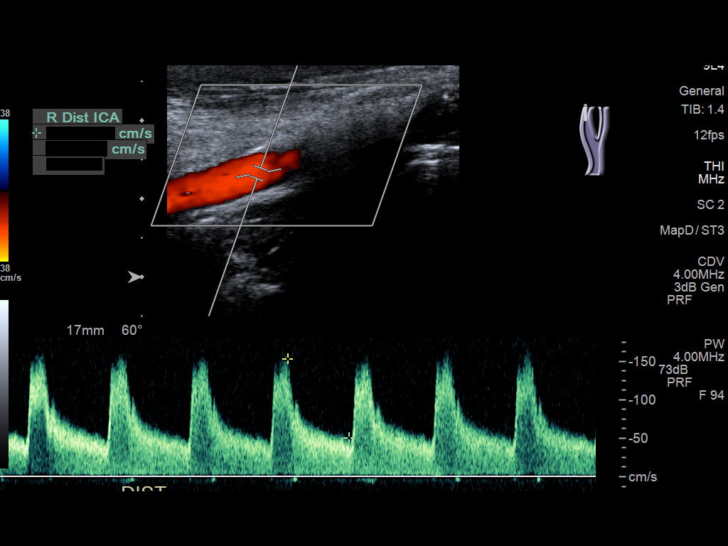
[im 36/69]
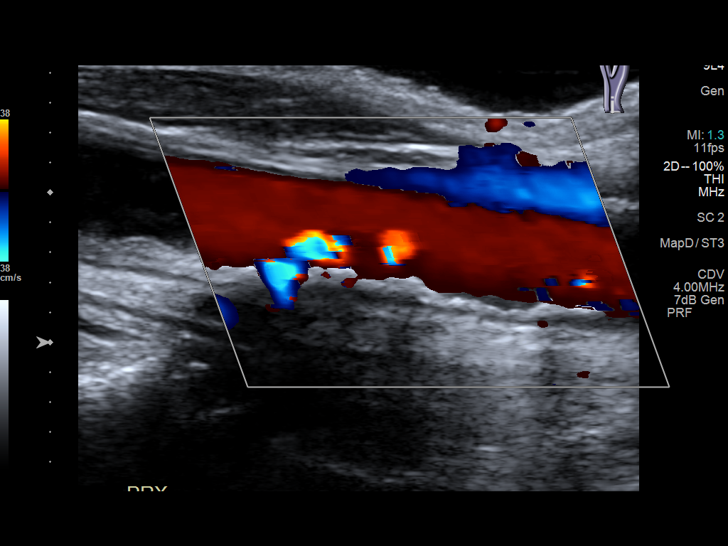
[im 39/69]
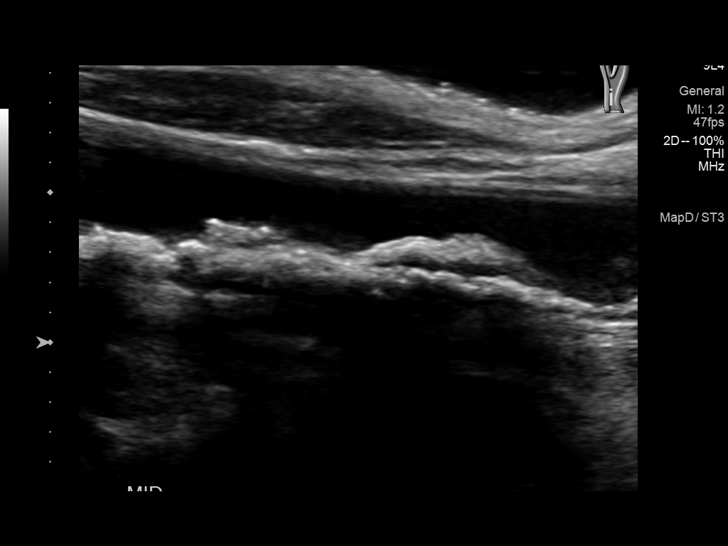
[im 45/69]
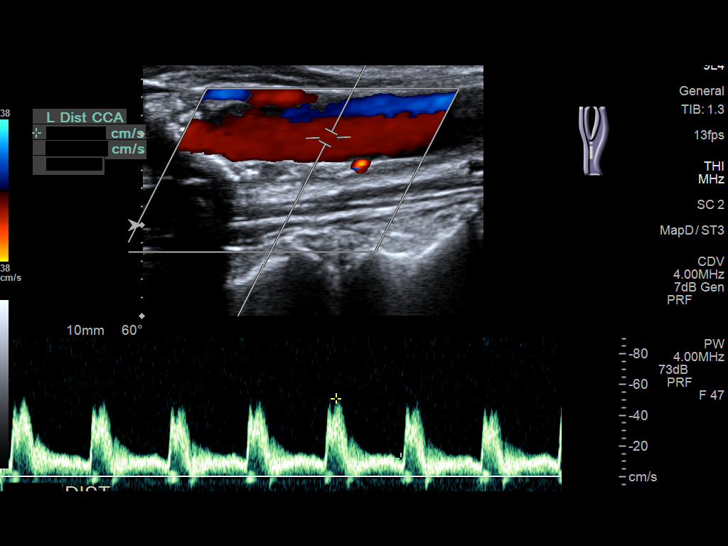
[im 51/69]
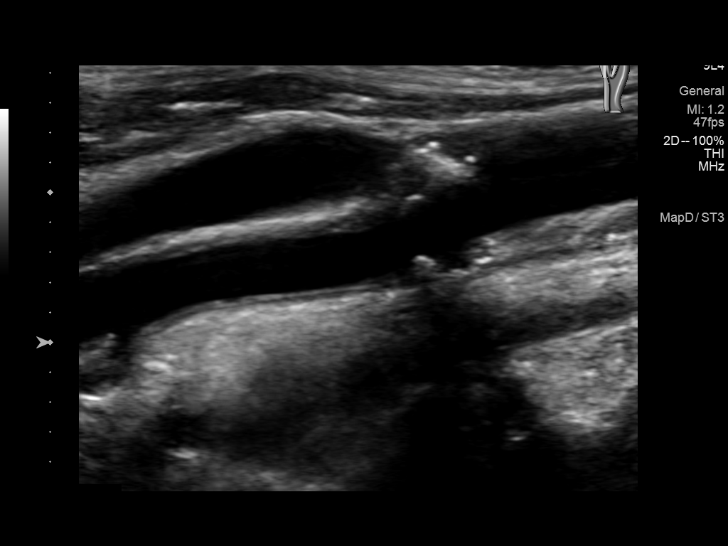
[im 57/69]
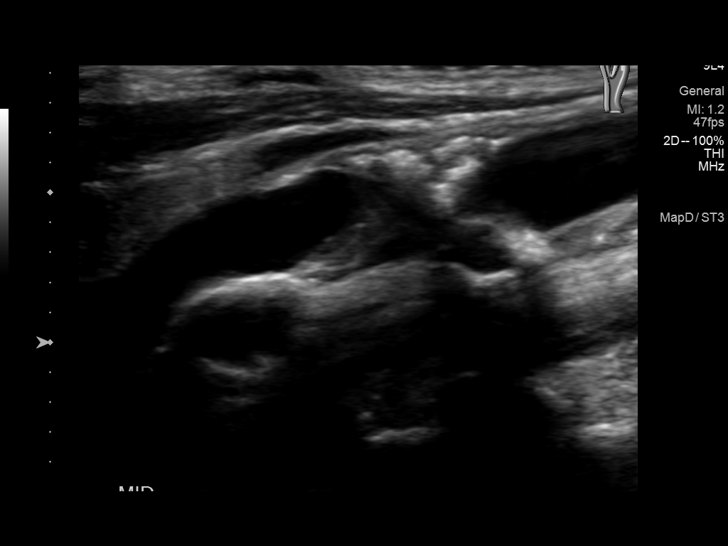
[im 63/69]
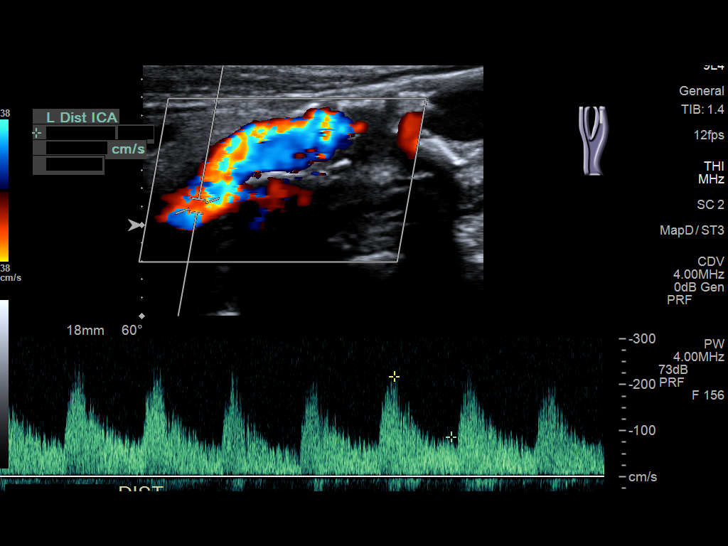
[im 69/69]
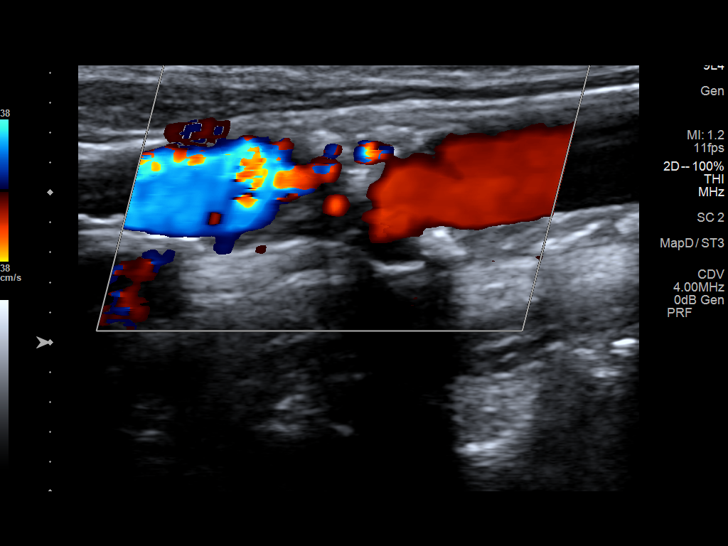

[13 of 24 positions shown; findings below may reference images not displayed]

FINDINGS: Criteria: Quantification of carotid stenosis is based on velocity
parameters that correlate the residual internal carotid diameter
with NASCET-based stenosis levels, using the diameter of the distal
internal carotid lumen as the denominator for stenosis measurement.

The following velocity measurements were obtained:

RIGHT

ICA:  154/51 distal, 69/18 proximal cm/sec

CCA:  83/21 cm/sec

SYSTOLIC ICA/CCA RATIO:

ECA:  87 cm/sec

LEFT

ICA:  390/133 cm/sec

CCA:  53/10 cm/sec

SYSTOLIC ICA/CCA RATIO:

ECA:  99 cm/sec

RIGHT CAROTID ARTERY: Moderate calcified plaque in the distal common
carotid artery, carotid bulb and internal carotid artery. Estimated
proximal right ICA stenosis is less than 50%.

RIGHT VERTEBRAL ARTERY: Antegrade flow with normal waveform and
velocity.

LEFT CAROTID ARTERY: Severe amount of predominately calcified plaque
is present at the level of the distal bulb and proximal left ICA.
Turbulent flow and elevated velocities correspond to an estimated
greater than 70% left ICA stenosis.

LEFT VERTEBRAL ARTERY: Antegrade flow with normal waveform and
velocity.
IMPRESSION: 1. Significant estimated greater than 70% proximal left ICA
stenosis.
2. Estimated less than 50% proximal right ICA stenosis.
